# Patient Record
Sex: Male | Born: 1935 | Race: White | Hispanic: No | Marital: Married | Smoking: Never smoker
Health system: Southern US, Community
[De-identification: ages and names within clinical notes are randomized; demographics above are authoritative.]

## PROBLEM LIST (undated history)

## (undated) DIAGNOSIS — I10 Essential (primary) hypertension: Secondary | ICD-10-CM

## (undated) DIAGNOSIS — J45909 Unspecified asthma, uncomplicated: Secondary | ICD-10-CM

## (undated) DIAGNOSIS — D649 Anemia, unspecified: Secondary | ICD-10-CM

## (undated) DIAGNOSIS — Z87442 Personal history of urinary calculi: Secondary | ICD-10-CM

## (undated) DIAGNOSIS — Z87898 Personal history of other specified conditions: Secondary | ICD-10-CM

## (undated) DIAGNOSIS — Z973 Presence of spectacles and contact lenses: Secondary | ICD-10-CM

## (undated) DIAGNOSIS — K219 Gastro-esophageal reflux disease without esophagitis: Secondary | ICD-10-CM

## (undated) DIAGNOSIS — E785 Hyperlipidemia, unspecified: Secondary | ICD-10-CM

## (undated) DIAGNOSIS — N189 Chronic kidney disease, unspecified: Secondary | ICD-10-CM

## (undated) DIAGNOSIS — N401 Enlarged prostate with lower urinary tract symptoms: Secondary | ICD-10-CM

## (undated) DIAGNOSIS — C61 Malignant neoplasm of prostate: Secondary | ICD-10-CM

## (undated) HISTORY — PX: TONSILLECTOMY: SUR1361

## (undated) HISTORY — DX: Hyperlipidemia, unspecified: E78.5

## (undated) HISTORY — DX: Malignant neoplasm of prostate: C61

## (undated) HISTORY — PX: INGUINAL HERNIA REPAIR: SUR1180

## (undated) HISTORY — PX: COLONOSCOPY: SHX174

---

## 2001-09-25 ENCOUNTER — Ambulatory Visit (HOSPITAL_COMMUNITY): Admission: RE | Admit: 2001-09-25 | Discharge: 2001-09-25 | Payer: Self-pay | Admitting: *Deleted

## 2006-11-20 ENCOUNTER — Encounter: Admission: RE | Admit: 2006-11-20 | Discharge: 2006-11-20 | Payer: Self-pay | Admitting: Internal Medicine

## 2006-11-23 ENCOUNTER — Ambulatory Visit (HOSPITAL_COMMUNITY): Admission: RE | Admit: 2006-11-23 | Discharge: 2006-11-23 | Payer: Self-pay | Admitting: *Deleted

## 2008-03-14 HISTORY — PX: EYE SURGERY: SHX253

## 2010-07-27 NOTE — Op Note (Signed)
Darrell Hernandez, Darrell Hernandez                ACCOUNT NO.:  [id number]   MEDICAL RECORD NO.:  [id number]          PATIENT TYPE:  AMB   LOCATION:  ENDO                         FACILITY:  [facility], [facility]   PHYSICIAN:  Georgiana Spinner, M.D.    DATE OF BIRTH:  05-19-1935   DATE OF PROCEDURE:  11/23/2006  DATE OF DISCHARGE:                               OPERATIVE REPORT   PROCEDURE:  Upper endoscopy.   INDICATIONS:  Abdominal pain, GI blood loss question.   ANESTHESIA:  Demerol 50 mg, Versed 7.5 mg.   DESCRIPTION OF PROCEDURE:  With the patient mildly sedated in the left  lateral decubitus position, the Pentax videoscopic endoscope was  inserted in the mouth and passed under direct vision through the  esophagus which appeared normal.  There was no evidence of reflux  esophagitis or Barrett's esophagus.  We entered into the stomach.  The  fundus, body, antrum, duodenal bulb, and second portion of the duodenum  all appeared normal.  From this point, the endoscope was slowly  withdrawn taking circumferential views of the duodenal mucosa until the  endoscope had been pulled back into the stomach and placed in  retroflexion to view the stomach from below. The endoscope was  straightened and withdrawn taking circumferential views of the remaining  gastric and esophageal mucosa.  The patient's vital signs and pulse  oximeter remained stable.  The patient tolerated the procedure well  without apparent complications.   FINDINGS:  Negative examination.   PLAN:  Have patient follow-up with me as an outpatient as needed.           ______________________________  Georgiana Spinner, M.D.     GMO/MEDQ  D:  11/23/2006  T:  11/23/2006  Job:  [id number]

## 2010-07-30 NOTE — Procedures (Signed)
[place]  Patient:    Darrell Hernandez, Darrell Hernandez Visit Number: [id number] MRN: [id number]          Service Type: END Location: ENDO Attending Physician:  Sabino Gasser Dictated by:   Sabino Gasser, M.D. Proc. Date: 09/25/01 Admit Date:  09/25/2001 Discharge Date: 09/25/2001                             Procedure Report  PROCEDURE PERFORMED:  Colonoscopy.  ENDOSCOPIST:  Sabino Gasser, M.D.  INDICATIONS FOR PROCEDURE:  Rectal bleeding.  ANESTHESIA:  Demerol 100 mg, Versed 10 mg.  DESCRIPTION OF PROCEDURE:  With the patient mildly sedated in the left lateral decubitus position, a rectal examination was performed which was unremarkable.  Subsequently, the Olympus videoscopic colonoscope was inserted in the rectum and passed under direct vision into the cecum, identified by the ileocecal valve and appendiceal orifice.  From this point, the colonoscope was slowly withdrawn, taking circumferential views of the entire colonic mucosa stopping only in the rectum, which appeared normal on direct and showed hemorrhoids on retroflex view.  The endoscope was straightened and withdrawn.  Patients vital signs and pulse oximeter remained stable.  The patient tolerated the procedure well and without apparent complications.  FINDINGS:  Internal hemorrhoids.  Otherwise unremarkable examination.  PLAN:  Followup this patient in five years. Dictated by:   Sabino Gasser, M.D. Attending Physician:  Sabino Gasser DD:  09/25/01 TD:  09/27/01 Job: [id number]

## 2011-03-30 DIAGNOSIS — H35379 Puckering of macula, unspecified eye: Secondary | ICD-10-CM | POA: Diagnosis not present

## 2011-03-30 DIAGNOSIS — H269 Unspecified cataract: Secondary | ICD-10-CM | POA: Diagnosis not present

## 2011-07-21 DIAGNOSIS — E7800 Pure hypercholesterolemia, unspecified: Secondary | ICD-10-CM | POA: Diagnosis not present

## 2011-07-21 DIAGNOSIS — I10 Essential (primary) hypertension: Secondary | ICD-10-CM | POA: Diagnosis not present

## 2011-07-21 DIAGNOSIS — Z125 Encounter for screening for malignant neoplasm of prostate: Secondary | ICD-10-CM | POA: Diagnosis not present

## 2011-07-28 DIAGNOSIS — I10 Essential (primary) hypertension: Secondary | ICD-10-CM | POA: Diagnosis not present

## 2011-07-28 DIAGNOSIS — N179 Acute kidney failure, unspecified: Secondary | ICD-10-CM | POA: Diagnosis not present

## 2011-07-28 DIAGNOSIS — R972 Elevated prostate specific antigen [PSA]: Secondary | ICD-10-CM | POA: Diagnosis not present

## 2011-08-03 DIAGNOSIS — N179 Acute kidney failure, unspecified: Secondary | ICD-10-CM | POA: Diagnosis not present

## 2011-08-30 DIAGNOSIS — I10 Essential (primary) hypertension: Secondary | ICD-10-CM | POA: Diagnosis not present

## 2011-09-19 DIAGNOSIS — R972 Elevated prostate specific antigen [PSA]: Secondary | ICD-10-CM | POA: Diagnosis not present

## 2011-09-19 DIAGNOSIS — K4090 Unilateral inguinal hernia, without obstruction or gangrene, not specified as recurrent: Secondary | ICD-10-CM | POA: Diagnosis not present

## 2011-12-12 DIAGNOSIS — L570 Actinic keratosis: Secondary | ICD-10-CM | POA: Diagnosis not present

## 2011-12-12 DIAGNOSIS — L439 Lichen planus, unspecified: Secondary | ICD-10-CM | POA: Diagnosis not present

## 2011-12-12 DIAGNOSIS — L821 Other seborrheic keratosis: Secondary | ICD-10-CM | POA: Diagnosis not present

## 2011-12-12 DIAGNOSIS — Z808 Family history of malignant neoplasm of other organs or systems: Secondary | ICD-10-CM | POA: Diagnosis not present

## 2011-12-12 DIAGNOSIS — D485 Neoplasm of uncertain behavior of skin: Secondary | ICD-10-CM | POA: Diagnosis not present

## 2011-12-12 DIAGNOSIS — Z85828 Personal history of other malignant neoplasm of skin: Secondary | ICD-10-CM | POA: Diagnosis not present

## 2012-02-01 DIAGNOSIS — C44721 Squamous cell carcinoma of skin of unspecified lower limb, including hip: Secondary | ICD-10-CM | POA: Diagnosis not present

## 2012-02-01 DIAGNOSIS — L905 Scar conditions and fibrosis of skin: Secondary | ICD-10-CM | POA: Diagnosis not present

## 2012-02-02 DIAGNOSIS — R972 Elevated prostate specific antigen [PSA]: Secondary | ICD-10-CM | POA: Diagnosis not present

## 2012-02-02 DIAGNOSIS — Z23 Encounter for immunization: Secondary | ICD-10-CM | POA: Diagnosis not present

## 2012-02-02 DIAGNOSIS — F411 Generalized anxiety disorder: Secondary | ICD-10-CM | POA: Diagnosis not present

## 2012-02-02 DIAGNOSIS — E7800 Pure hypercholesterolemia, unspecified: Secondary | ICD-10-CM | POA: Diagnosis not present

## 2012-02-02 DIAGNOSIS — I10 Essential (primary) hypertension: Secondary | ICD-10-CM | POA: Diagnosis not present

## 2012-02-15 DIAGNOSIS — I10 Essential (primary) hypertension: Secondary | ICD-10-CM | POA: Diagnosis not present

## 2012-02-15 DIAGNOSIS — K219 Gastro-esophageal reflux disease without esophagitis: Secondary | ICD-10-CM | POA: Diagnosis not present

## 2012-02-15 DIAGNOSIS — Z1211 Encounter for screening for malignant neoplasm of colon: Secondary | ICD-10-CM | POA: Diagnosis not present

## 2012-03-19 DIAGNOSIS — R972 Elevated prostate specific antigen [PSA]: Secondary | ICD-10-CM | POA: Diagnosis not present

## 2012-03-22 DIAGNOSIS — Z86010 Personal history of colonic polyps: Secondary | ICD-10-CM | POA: Diagnosis not present

## 2012-03-22 DIAGNOSIS — D126 Benign neoplasm of colon, unspecified: Secondary | ICD-10-CM | POA: Diagnosis not present

## 2012-03-26 DIAGNOSIS — K4090 Unilateral inguinal hernia, without obstruction or gangrene, not specified as recurrent: Secondary | ICD-10-CM | POA: Diagnosis not present

## 2012-03-26 DIAGNOSIS — R972 Elevated prostate specific antigen [PSA]: Secondary | ICD-10-CM | POA: Diagnosis not present

## 2012-05-15 DIAGNOSIS — L570 Actinic keratosis: Secondary | ICD-10-CM | POA: Diagnosis not present

## 2012-05-15 DIAGNOSIS — L905 Scar conditions and fibrosis of skin: Secondary | ICD-10-CM | POA: Diagnosis not present

## 2012-05-15 DIAGNOSIS — C44721 Squamous cell carcinoma of skin of unspecified lower limb, including hip: Secondary | ICD-10-CM | POA: Diagnosis not present

## 2012-05-15 DIAGNOSIS — Z85828 Personal history of other malignant neoplasm of skin: Secondary | ICD-10-CM | POA: Diagnosis not present

## 2012-05-15 DIAGNOSIS — D485 Neoplasm of uncertain behavior of skin: Secondary | ICD-10-CM | POA: Diagnosis not present

## 2012-05-15 DIAGNOSIS — L821 Other seborrheic keratosis: Secondary | ICD-10-CM | POA: Diagnosis not present

## 2012-06-04 DIAGNOSIS — H2510 Age-related nuclear cataract, unspecified eye: Secondary | ICD-10-CM | POA: Diagnosis not present

## 2012-06-04 DIAGNOSIS — Z961 Presence of intraocular lens: Secondary | ICD-10-CM | POA: Diagnosis not present

## 2012-06-04 DIAGNOSIS — H2640 Unspecified secondary cataract: Secondary | ICD-10-CM | POA: Diagnosis not present

## 2012-08-09 DIAGNOSIS — I10 Essential (primary) hypertension: Secondary | ICD-10-CM | POA: Diagnosis not present

## 2012-08-09 DIAGNOSIS — F411 Generalized anxiety disorder: Secondary | ICD-10-CM | POA: Diagnosis not present

## 2012-08-15 DIAGNOSIS — I10 Essential (primary) hypertension: Secondary | ICD-10-CM | POA: Diagnosis not present

## 2012-08-15 DIAGNOSIS — K219 Gastro-esophageal reflux disease without esophagitis: Secondary | ICD-10-CM | POA: Diagnosis not present

## 2012-08-15 DIAGNOSIS — R972 Elevated prostate specific antigen [PSA]: Secondary | ICD-10-CM | POA: Diagnosis not present

## 2012-08-15 DIAGNOSIS — E7800 Pure hypercholesterolemia, unspecified: Secondary | ICD-10-CM | POA: Diagnosis not present

## 2012-09-26 DIAGNOSIS — D485 Neoplasm of uncertain behavior of skin: Secondary | ICD-10-CM | POA: Diagnosis not present

## 2012-09-26 DIAGNOSIS — C44621 Squamous cell carcinoma of skin of unspecified upper limb, including shoulder: Secondary | ICD-10-CM | POA: Diagnosis not present

## 2012-09-26 DIAGNOSIS — C44721 Squamous cell carcinoma of skin of unspecified lower limb, including hip: Secondary | ICD-10-CM | POA: Diagnosis not present

## 2012-09-26 DIAGNOSIS — L570 Actinic keratosis: Secondary | ICD-10-CM | POA: Diagnosis not present

## 2012-10-19 DIAGNOSIS — H2640 Unspecified secondary cataract: Secondary | ICD-10-CM | POA: Diagnosis not present

## 2012-11-06 DIAGNOSIS — C44621 Squamous cell carcinoma of skin of unspecified upper limb, including shoulder: Secondary | ICD-10-CM | POA: Diagnosis not present

## 2012-11-06 DIAGNOSIS — L905 Scar conditions and fibrosis of skin: Secondary | ICD-10-CM | POA: Diagnosis not present

## 2012-11-06 DIAGNOSIS — C4492 Squamous cell carcinoma of skin, unspecified: Secondary | ICD-10-CM | POA: Diagnosis not present

## 2012-12-13 DIAGNOSIS — I10 Essential (primary) hypertension: Secondary | ICD-10-CM | POA: Diagnosis not present

## 2012-12-19 DIAGNOSIS — E7800 Pure hypercholesterolemia, unspecified: Secondary | ICD-10-CM | POA: Diagnosis not present

## 2012-12-19 DIAGNOSIS — Z23 Encounter for immunization: Secondary | ICD-10-CM | POA: Diagnosis not present

## 2012-12-19 DIAGNOSIS — K219 Gastro-esophageal reflux disease without esophagitis: Secondary | ICD-10-CM | POA: Diagnosis not present

## 2012-12-19 DIAGNOSIS — I10 Essential (primary) hypertension: Secondary | ICD-10-CM | POA: Diagnosis not present

## 2012-12-31 DIAGNOSIS — Z808 Family history of malignant neoplasm of other organs or systems: Secondary | ICD-10-CM | POA: Diagnosis not present

## 2012-12-31 DIAGNOSIS — L570 Actinic keratosis: Secondary | ICD-10-CM | POA: Diagnosis not present

## 2012-12-31 DIAGNOSIS — L819 Disorder of pigmentation, unspecified: Secondary | ICD-10-CM | POA: Diagnosis not present

## 2012-12-31 DIAGNOSIS — Z85828 Personal history of other malignant neoplasm of skin: Secondary | ICD-10-CM | POA: Diagnosis not present

## 2012-12-31 DIAGNOSIS — L0889 Other specified local infections of the skin and subcutaneous tissue: Secondary | ICD-10-CM | POA: Diagnosis not present

## 2012-12-31 DIAGNOSIS — L821 Other seborrheic keratosis: Secondary | ICD-10-CM | POA: Diagnosis not present

## 2012-12-31 DIAGNOSIS — D485 Neoplasm of uncertain behavior of skin: Secondary | ICD-10-CM | POA: Diagnosis not present

## 2012-12-31 DIAGNOSIS — L820 Inflamed seborrheic keratosis: Secondary | ICD-10-CM | POA: Diagnosis not present

## 2013-03-14 HISTORY — PX: CATARACT EXTRACTION W/ INTRAOCULAR LENS IMPLANT: SHX1309

## 2013-04-18 DIAGNOSIS — R972 Elevated prostate specific antigen [PSA]: Secondary | ICD-10-CM | POA: Diagnosis not present

## 2013-04-24 DIAGNOSIS — N318 Other neuromuscular dysfunction of bladder: Secondary | ICD-10-CM | POA: Diagnosis not present

## 2013-04-24 DIAGNOSIS — R972 Elevated prostate specific antigen [PSA]: Secondary | ICD-10-CM | POA: Diagnosis not present

## 2013-06-20 DIAGNOSIS — I10 Essential (primary) hypertension: Secondary | ICD-10-CM | POA: Diagnosis not present

## 2013-06-25 DIAGNOSIS — R972 Elevated prostate specific antigen [PSA]: Secondary | ICD-10-CM | POA: Diagnosis not present

## 2013-06-25 DIAGNOSIS — K219 Gastro-esophageal reflux disease without esophagitis: Secondary | ICD-10-CM | POA: Diagnosis not present

## 2013-06-25 DIAGNOSIS — E7800 Pure hypercholesterolemia, unspecified: Secondary | ICD-10-CM | POA: Diagnosis not present

## 2013-06-25 DIAGNOSIS — I10 Essential (primary) hypertension: Secondary | ICD-10-CM | POA: Diagnosis not present

## 2013-07-16 DIAGNOSIS — K219 Gastro-esophageal reflux disease without esophagitis: Secondary | ICD-10-CM | POA: Diagnosis not present

## 2013-07-16 DIAGNOSIS — E7800 Pure hypercholesterolemia, unspecified: Secondary | ICD-10-CM | POA: Diagnosis not present

## 2013-07-16 DIAGNOSIS — I10 Essential (primary) hypertension: Secondary | ICD-10-CM | POA: Diagnosis not present

## 2013-07-29 DIAGNOSIS — I10 Essential (primary) hypertension: Secondary | ICD-10-CM | POA: Diagnosis not present

## 2013-07-29 DIAGNOSIS — N183 Chronic kidney disease, stage 3 unspecified: Secondary | ICD-10-CM | POA: Diagnosis not present

## 2013-07-29 DIAGNOSIS — D0470 Carcinoma in situ of skin of unspecified lower limb, including hip: Secondary | ICD-10-CM | POA: Diagnosis not present

## 2013-07-29 DIAGNOSIS — Z85828 Personal history of other malignant neoplasm of skin: Secondary | ICD-10-CM | POA: Diagnosis not present

## 2013-07-29 DIAGNOSIS — L570 Actinic keratosis: Secondary | ICD-10-CM | POA: Diagnosis not present

## 2013-07-29 DIAGNOSIS — D485 Neoplasm of uncertain behavior of skin: Secondary | ICD-10-CM | POA: Diagnosis not present

## 2013-07-29 DIAGNOSIS — L821 Other seborrheic keratosis: Secondary | ICD-10-CM | POA: Diagnosis not present

## 2013-07-29 DIAGNOSIS — Z808 Family history of malignant neoplasm of other organs or systems: Secondary | ICD-10-CM | POA: Diagnosis not present

## 2013-08-06 DIAGNOSIS — D0470 Carcinoma in situ of skin of unspecified lower limb, including hip: Secondary | ICD-10-CM | POA: Diagnosis not present

## 2013-11-09 DIAGNOSIS — M5430 Sciatica, unspecified side: Secondary | ICD-10-CM | POA: Diagnosis not present

## 2013-11-12 DIAGNOSIS — M5430 Sciatica, unspecified side: Secondary | ICD-10-CM | POA: Diagnosis not present

## 2013-11-13 ENCOUNTER — Other Ambulatory Visit: Payer: Self-pay | Admitting: Internal Medicine

## 2013-11-13 DIAGNOSIS — M4310 Spondylolisthesis, site unspecified: Secondary | ICD-10-CM | POA: Diagnosis not present

## 2013-11-13 DIAGNOSIS — M5431 Sciatica, right side: Secondary | ICD-10-CM

## 2013-11-15 ENCOUNTER — Ambulatory Visit
Admission: RE | Admit: 2013-11-15 | Discharge: 2013-11-15 | Disposition: A | Discharge status: Home / Self Care | Payer: Medicare Other | Source: Ambulatory Visit | Attending: Internal Medicine | Admitting: Internal Medicine

## 2013-11-15 DIAGNOSIS — IMO0002 Reserved for concepts with insufficient information to code with codable children: Secondary | ICD-10-CM | POA: Diagnosis not present

## 2013-11-15 DIAGNOSIS — M5431 Sciatica, right side: Secondary | ICD-10-CM

## 2013-11-15 DIAGNOSIS — M48061 Spinal stenosis, lumbar region without neurogenic claudication: Secondary | ICD-10-CM | POA: Diagnosis not present

## 2013-11-15 DIAGNOSIS — M5126 Other intervertebral disc displacement, lumbar region: Secondary | ICD-10-CM | POA: Diagnosis not present

## 2013-11-19 DIAGNOSIS — G894 Chronic pain syndrome: Secondary | ICD-10-CM | POA: Diagnosis not present

## 2013-11-19 DIAGNOSIS — M545 Low back pain, unspecified: Secondary | ICD-10-CM | POA: Diagnosis not present

## 2013-11-19 DIAGNOSIS — M5430 Sciatica, unspecified side: Secondary | ICD-10-CM | POA: Diagnosis not present

## 2013-11-19 DIAGNOSIS — M25559 Pain in unspecified hip: Secondary | ICD-10-CM | POA: Diagnosis not present

## 2013-12-05 DIAGNOSIS — M79609 Pain in unspecified limb: Secondary | ICD-10-CM | POA: Diagnosis not present

## 2013-12-05 DIAGNOSIS — IMO0002 Reserved for concepts with insufficient information to code with codable children: Secondary | ICD-10-CM | POA: Diagnosis not present

## 2013-12-11 ENCOUNTER — Other Ambulatory Visit: Payer: Self-pay | Admitting: Nephrology

## 2013-12-11 DIAGNOSIS — N183 Chronic kidney disease, stage 3 unspecified: Secondary | ICD-10-CM | POA: Diagnosis not present

## 2013-12-11 DIAGNOSIS — R972 Elevated prostate specific antigen [PSA]: Secondary | ICD-10-CM | POA: Diagnosis not present

## 2013-12-11 DIAGNOSIS — I120 Hypertensive chronic kidney disease with stage 5 chronic kidney disease or end stage renal disease: Secondary | ICD-10-CM | POA: Diagnosis not present

## 2013-12-11 DIAGNOSIS — E785 Hyperlipidemia, unspecified: Secondary | ICD-10-CM | POA: Diagnosis not present

## 2013-12-18 DIAGNOSIS — Z961 Presence of intraocular lens: Secondary | ICD-10-CM | POA: Diagnosis not present

## 2013-12-18 DIAGNOSIS — H2512 Age-related nuclear cataract, left eye: Secondary | ICD-10-CM | POA: Diagnosis not present

## 2013-12-19 DIAGNOSIS — M47817 Spondylosis without myelopathy or radiculopathy, lumbosacral region: Secondary | ICD-10-CM | POA: Diagnosis not present

## 2013-12-19 DIAGNOSIS — D649 Anemia, unspecified: Secondary | ICD-10-CM | POA: Diagnosis not present

## 2013-12-19 DIAGNOSIS — I10 Essential (primary) hypertension: Secondary | ICD-10-CM | POA: Diagnosis not present

## 2013-12-20 ENCOUNTER — Ambulatory Visit
Admission: RE | Admit: 2013-12-20 | Discharge: 2013-12-20 | Disposition: A | Discharge status: Home / Self Care | Payer: Medicare Other | Source: Ambulatory Visit | Attending: Nephrology | Admitting: Nephrology

## 2013-12-20 DIAGNOSIS — N183 Chronic kidney disease, stage 3 (moderate): Secondary | ICD-10-CM

## 2013-12-20 DIAGNOSIS — N281 Cyst of kidney, acquired: Secondary | ICD-10-CM | POA: Diagnosis not present

## 2013-12-24 DIAGNOSIS — I10 Essential (primary) hypertension: Secondary | ICD-10-CM | POA: Diagnosis not present

## 2013-12-24 DIAGNOSIS — K219 Gastro-esophageal reflux disease without esophagitis: Secondary | ICD-10-CM | POA: Diagnosis not present

## 2013-12-24 DIAGNOSIS — N183 Chronic kidney disease, stage 3 (moderate): Secondary | ICD-10-CM | POA: Diagnosis not present

## 2013-12-24 DIAGNOSIS — E780 Pure hypercholesterolemia: Secondary | ICD-10-CM | POA: Diagnosis not present

## 2014-02-18 DIAGNOSIS — I10 Essential (primary) hypertension: Secondary | ICD-10-CM | POA: Diagnosis not present

## 2014-02-21 DIAGNOSIS — E785 Hyperlipidemia, unspecified: Secondary | ICD-10-CM | POA: Diagnosis not present

## 2014-02-21 DIAGNOSIS — N183 Chronic kidney disease, stage 3 (moderate): Secondary | ICD-10-CM | POA: Diagnosis not present

## 2014-02-21 DIAGNOSIS — I129 Hypertensive chronic kidney disease with stage 1 through stage 4 chronic kidney disease, or unspecified chronic kidney disease: Secondary | ICD-10-CM | POA: Diagnosis not present

## 2014-02-21 DIAGNOSIS — R972 Elevated prostate specific antigen [PSA]: Secondary | ICD-10-CM | POA: Diagnosis not present

## 2014-03-04 DIAGNOSIS — I10 Essential (primary) hypertension: Secondary | ICD-10-CM | POA: Diagnosis not present

## 2014-06-23 DIAGNOSIS — I10 Essential (primary) hypertension: Secondary | ICD-10-CM | POA: Diagnosis not present

## 2014-06-26 DIAGNOSIS — E780 Pure hypercholesterolemia: Secondary | ICD-10-CM | POA: Diagnosis not present

## 2014-06-26 DIAGNOSIS — K219 Gastro-esophageal reflux disease without esophagitis: Secondary | ICD-10-CM | POA: Diagnosis not present

## 2014-06-26 DIAGNOSIS — I10 Essential (primary) hypertension: Secondary | ICD-10-CM | POA: Diagnosis not present

## 2014-06-26 DIAGNOSIS — M25512 Pain in left shoulder: Secondary | ICD-10-CM | POA: Diagnosis not present

## 2014-08-04 DIAGNOSIS — M7591 Shoulder lesion, unspecified, right shoulder: Secondary | ICD-10-CM | POA: Diagnosis not present

## 2014-08-04 DIAGNOSIS — M25519 Pain in unspecified shoulder: Secondary | ICD-10-CM | POA: Diagnosis not present

## 2014-08-04 DIAGNOSIS — M19011 Primary osteoarthritis, right shoulder: Secondary | ICD-10-CM | POA: Diagnosis not present

## 2014-08-04 DIAGNOSIS — M7592 Shoulder lesion, unspecified, left shoulder: Secondary | ICD-10-CM | POA: Diagnosis not present

## 2014-08-04 DIAGNOSIS — M19012 Primary osteoarthritis, left shoulder: Secondary | ICD-10-CM | POA: Diagnosis not present

## 2014-08-04 DIAGNOSIS — M7580 Other shoulder lesions, unspecified shoulder: Secondary | ICD-10-CM | POA: Diagnosis not present

## 2014-08-07 DIAGNOSIS — M25511 Pain in right shoulder: Secondary | ICD-10-CM | POA: Diagnosis not present

## 2014-08-07 DIAGNOSIS — M25512 Pain in left shoulder: Secondary | ICD-10-CM | POA: Diagnosis not present

## 2014-08-07 DIAGNOSIS — M25612 Stiffness of left shoulder, not elsewhere classified: Secondary | ICD-10-CM | POA: Diagnosis not present

## 2014-08-07 DIAGNOSIS — M25611 Stiffness of right shoulder, not elsewhere classified: Secondary | ICD-10-CM | POA: Diagnosis not present

## 2014-08-08 DIAGNOSIS — M25612 Stiffness of left shoulder, not elsewhere classified: Secondary | ICD-10-CM | POA: Diagnosis not present

## 2014-08-08 DIAGNOSIS — M25512 Pain in left shoulder: Secondary | ICD-10-CM | POA: Diagnosis not present

## 2014-08-08 DIAGNOSIS — M25611 Stiffness of right shoulder, not elsewhere classified: Secondary | ICD-10-CM | POA: Diagnosis not present

## 2014-08-08 DIAGNOSIS — M25511 Pain in right shoulder: Secondary | ICD-10-CM | POA: Diagnosis not present

## 2014-08-14 DIAGNOSIS — M25512 Pain in left shoulder: Secondary | ICD-10-CM | POA: Diagnosis not present

## 2014-08-14 DIAGNOSIS — M25511 Pain in right shoulder: Secondary | ICD-10-CM | POA: Diagnosis not present

## 2014-08-14 DIAGNOSIS — M25611 Stiffness of right shoulder, not elsewhere classified: Secondary | ICD-10-CM | POA: Diagnosis not present

## 2014-08-14 DIAGNOSIS — M25612 Stiffness of left shoulder, not elsewhere classified: Secondary | ICD-10-CM | POA: Diagnosis not present

## 2014-08-18 DIAGNOSIS — M25511 Pain in right shoulder: Secondary | ICD-10-CM | POA: Diagnosis not present

## 2014-08-18 DIAGNOSIS — M25611 Stiffness of right shoulder, not elsewhere classified: Secondary | ICD-10-CM | POA: Diagnosis not present

## 2014-08-18 DIAGNOSIS — M25512 Pain in left shoulder: Secondary | ICD-10-CM | POA: Diagnosis not present

## 2014-08-18 DIAGNOSIS — M25612 Stiffness of left shoulder, not elsewhere classified: Secondary | ICD-10-CM | POA: Diagnosis not present

## 2014-08-20 DIAGNOSIS — L821 Other seborrheic keratosis: Secondary | ICD-10-CM | POA: Diagnosis not present

## 2014-08-20 DIAGNOSIS — M25512 Pain in left shoulder: Secondary | ICD-10-CM | POA: Diagnosis not present

## 2014-08-20 DIAGNOSIS — M25511 Pain in right shoulder: Secondary | ICD-10-CM | POA: Diagnosis not present

## 2014-08-20 DIAGNOSIS — L219 Seborrheic dermatitis, unspecified: Secondary | ICD-10-CM | POA: Diagnosis not present

## 2014-08-20 DIAGNOSIS — Z85828 Personal history of other malignant neoplasm of skin: Secondary | ICD-10-CM | POA: Diagnosis not present

## 2014-08-20 DIAGNOSIS — L570 Actinic keratosis: Secondary | ICD-10-CM | POA: Diagnosis not present

## 2014-08-20 DIAGNOSIS — M25611 Stiffness of right shoulder, not elsewhere classified: Secondary | ICD-10-CM | POA: Diagnosis not present

## 2014-08-20 DIAGNOSIS — M25612 Stiffness of left shoulder, not elsewhere classified: Secondary | ICD-10-CM | POA: Diagnosis not present

## 2014-08-20 DIAGNOSIS — D485 Neoplasm of uncertain behavior of skin: Secondary | ICD-10-CM | POA: Diagnosis not present

## 2014-08-28 DIAGNOSIS — M25512 Pain in left shoulder: Secondary | ICD-10-CM | POA: Diagnosis not present

## 2014-08-28 DIAGNOSIS — M25612 Stiffness of left shoulder, not elsewhere classified: Secondary | ICD-10-CM | POA: Diagnosis not present

## 2014-08-28 DIAGNOSIS — M25611 Stiffness of right shoulder, not elsewhere classified: Secondary | ICD-10-CM | POA: Diagnosis not present

## 2014-08-28 DIAGNOSIS — M25511 Pain in right shoulder: Secondary | ICD-10-CM | POA: Diagnosis not present

## 2014-09-02 DIAGNOSIS — M25612 Stiffness of left shoulder, not elsewhere classified: Secondary | ICD-10-CM | POA: Diagnosis not present

## 2014-09-02 DIAGNOSIS — M25611 Stiffness of right shoulder, not elsewhere classified: Secondary | ICD-10-CM | POA: Diagnosis not present

## 2014-09-02 DIAGNOSIS — M25512 Pain in left shoulder: Secondary | ICD-10-CM | POA: Diagnosis not present

## 2014-09-02 DIAGNOSIS — M25511 Pain in right shoulder: Secondary | ICD-10-CM | POA: Diagnosis not present

## 2014-09-04 DIAGNOSIS — M25612 Stiffness of left shoulder, not elsewhere classified: Secondary | ICD-10-CM | POA: Diagnosis not present

## 2014-09-04 DIAGNOSIS — M25611 Stiffness of right shoulder, not elsewhere classified: Secondary | ICD-10-CM | POA: Diagnosis not present

## 2014-09-04 DIAGNOSIS — M25511 Pain in right shoulder: Secondary | ICD-10-CM | POA: Diagnosis not present

## 2014-09-04 DIAGNOSIS — M25512 Pain in left shoulder: Secondary | ICD-10-CM | POA: Diagnosis not present

## 2014-09-09 DIAGNOSIS — M25611 Stiffness of right shoulder, not elsewhere classified: Secondary | ICD-10-CM | POA: Diagnosis not present

## 2014-09-09 DIAGNOSIS — M25512 Pain in left shoulder: Secondary | ICD-10-CM | POA: Diagnosis not present

## 2014-09-09 DIAGNOSIS — M25612 Stiffness of left shoulder, not elsewhere classified: Secondary | ICD-10-CM | POA: Diagnosis not present

## 2014-09-09 DIAGNOSIS — M25511 Pain in right shoulder: Secondary | ICD-10-CM | POA: Diagnosis not present

## 2014-09-11 DIAGNOSIS — M25511 Pain in right shoulder: Secondary | ICD-10-CM | POA: Diagnosis not present

## 2014-09-11 DIAGNOSIS — M25611 Stiffness of right shoulder, not elsewhere classified: Secondary | ICD-10-CM | POA: Diagnosis not present

## 2014-09-11 DIAGNOSIS — M25512 Pain in left shoulder: Secondary | ICD-10-CM | POA: Diagnosis not present

## 2014-09-11 DIAGNOSIS — M25612 Stiffness of left shoulder, not elsewhere classified: Secondary | ICD-10-CM | POA: Diagnosis not present

## 2014-09-17 DIAGNOSIS — M25512 Pain in left shoulder: Secondary | ICD-10-CM | POA: Diagnosis not present

## 2014-09-17 DIAGNOSIS — M25611 Stiffness of right shoulder, not elsewhere classified: Secondary | ICD-10-CM | POA: Diagnosis not present

## 2014-09-17 DIAGNOSIS — M25511 Pain in right shoulder: Secondary | ICD-10-CM | POA: Diagnosis not present

## 2014-09-17 DIAGNOSIS — M25612 Stiffness of left shoulder, not elsewhere classified: Secondary | ICD-10-CM | POA: Diagnosis not present

## 2014-09-19 DIAGNOSIS — M25512 Pain in left shoulder: Secondary | ICD-10-CM | POA: Diagnosis not present

## 2014-09-19 DIAGNOSIS — M25511 Pain in right shoulder: Secondary | ICD-10-CM | POA: Diagnosis not present

## 2014-09-19 DIAGNOSIS — M25611 Stiffness of right shoulder, not elsewhere classified: Secondary | ICD-10-CM | POA: Diagnosis not present

## 2014-09-19 DIAGNOSIS — M25612 Stiffness of left shoulder, not elsewhere classified: Secondary | ICD-10-CM | POA: Diagnosis not present

## 2014-09-23 DIAGNOSIS — M25612 Stiffness of left shoulder, not elsewhere classified: Secondary | ICD-10-CM | POA: Diagnosis not present

## 2014-09-23 DIAGNOSIS — M25511 Pain in right shoulder: Secondary | ICD-10-CM | POA: Diagnosis not present

## 2014-09-23 DIAGNOSIS — M25512 Pain in left shoulder: Secondary | ICD-10-CM | POA: Diagnosis not present

## 2014-09-23 DIAGNOSIS — M25611 Stiffness of right shoulder, not elsewhere classified: Secondary | ICD-10-CM | POA: Diagnosis not present

## 2014-09-25 DIAGNOSIS — M25511 Pain in right shoulder: Secondary | ICD-10-CM | POA: Diagnosis not present

## 2014-09-25 DIAGNOSIS — M25611 Stiffness of right shoulder, not elsewhere classified: Secondary | ICD-10-CM | POA: Diagnosis not present

## 2014-09-25 DIAGNOSIS — M25612 Stiffness of left shoulder, not elsewhere classified: Secondary | ICD-10-CM | POA: Diagnosis not present

## 2014-09-25 DIAGNOSIS — M25512 Pain in left shoulder: Secondary | ICD-10-CM | POA: Diagnosis not present

## 2014-09-26 DIAGNOSIS — Z87442 Personal history of urinary calculi: Secondary | ICD-10-CM | POA: Diagnosis not present

## 2014-09-26 DIAGNOSIS — I129 Hypertensive chronic kidney disease with stage 1 through stage 4 chronic kidney disease, or unspecified chronic kidney disease: Secondary | ICD-10-CM | POA: Diagnosis not present

## 2014-09-26 DIAGNOSIS — E785 Hyperlipidemia, unspecified: Secondary | ICD-10-CM | POA: Diagnosis not present

## 2014-09-26 DIAGNOSIS — N183 Chronic kidney disease, stage 3 (moderate): Secondary | ICD-10-CM | POA: Diagnosis not present

## 2014-09-29 DIAGNOSIS — M25612 Stiffness of left shoulder, not elsewhere classified: Secondary | ICD-10-CM | POA: Diagnosis not present

## 2014-09-29 DIAGNOSIS — M25512 Pain in left shoulder: Secondary | ICD-10-CM | POA: Diagnosis not present

## 2014-09-29 DIAGNOSIS — M25611 Stiffness of right shoulder, not elsewhere classified: Secondary | ICD-10-CM | POA: Diagnosis not present

## 2014-09-29 DIAGNOSIS — M25511 Pain in right shoulder: Secondary | ICD-10-CM | POA: Diagnosis not present

## 2014-10-02 DIAGNOSIS — M25612 Stiffness of left shoulder, not elsewhere classified: Secondary | ICD-10-CM | POA: Diagnosis not present

## 2014-10-02 DIAGNOSIS — M25511 Pain in right shoulder: Secondary | ICD-10-CM | POA: Diagnosis not present

## 2014-10-02 DIAGNOSIS — M25512 Pain in left shoulder: Secondary | ICD-10-CM | POA: Diagnosis not present

## 2014-10-02 DIAGNOSIS — M25611 Stiffness of right shoulder, not elsewhere classified: Secondary | ICD-10-CM | POA: Diagnosis not present

## 2014-10-06 DIAGNOSIS — M25611 Stiffness of right shoulder, not elsewhere classified: Secondary | ICD-10-CM | POA: Diagnosis not present

## 2014-10-06 DIAGNOSIS — M25612 Stiffness of left shoulder, not elsewhere classified: Secondary | ICD-10-CM | POA: Diagnosis not present

## 2014-10-06 DIAGNOSIS — M25511 Pain in right shoulder: Secondary | ICD-10-CM | POA: Diagnosis not present

## 2014-10-06 DIAGNOSIS — M25512 Pain in left shoulder: Secondary | ICD-10-CM | POA: Diagnosis not present

## 2014-10-09 DIAGNOSIS — M25612 Stiffness of left shoulder, not elsewhere classified: Secondary | ICD-10-CM | POA: Diagnosis not present

## 2014-10-09 DIAGNOSIS — M25611 Stiffness of right shoulder, not elsewhere classified: Secondary | ICD-10-CM | POA: Diagnosis not present

## 2014-10-09 DIAGNOSIS — M25512 Pain in left shoulder: Secondary | ICD-10-CM | POA: Diagnosis not present

## 2014-10-09 DIAGNOSIS — M25511 Pain in right shoulder: Secondary | ICD-10-CM | POA: Diagnosis not present

## 2014-10-20 DIAGNOSIS — M25511 Pain in right shoulder: Secondary | ICD-10-CM | POA: Diagnosis not present

## 2014-10-20 DIAGNOSIS — M25612 Stiffness of left shoulder, not elsewhere classified: Secondary | ICD-10-CM | POA: Diagnosis not present

## 2014-10-20 DIAGNOSIS — M25611 Stiffness of right shoulder, not elsewhere classified: Secondary | ICD-10-CM | POA: Diagnosis not present

## 2014-10-20 DIAGNOSIS — M25512 Pain in left shoulder: Secondary | ICD-10-CM | POA: Diagnosis not present

## 2014-10-23 DIAGNOSIS — M25512 Pain in left shoulder: Secondary | ICD-10-CM | POA: Diagnosis not present

## 2014-10-23 DIAGNOSIS — M25612 Stiffness of left shoulder, not elsewhere classified: Secondary | ICD-10-CM | POA: Diagnosis not present

## 2014-10-23 DIAGNOSIS — M25611 Stiffness of right shoulder, not elsewhere classified: Secondary | ICD-10-CM | POA: Diagnosis not present

## 2014-10-23 DIAGNOSIS — M25511 Pain in right shoulder: Secondary | ICD-10-CM | POA: Diagnosis not present

## 2014-11-04 DIAGNOSIS — M25512 Pain in left shoulder: Secondary | ICD-10-CM | POA: Diagnosis not present

## 2014-11-04 DIAGNOSIS — M25612 Stiffness of left shoulder, not elsewhere classified: Secondary | ICD-10-CM | POA: Diagnosis not present

## 2014-11-04 DIAGNOSIS — M25611 Stiffness of right shoulder, not elsewhere classified: Secondary | ICD-10-CM | POA: Diagnosis not present

## 2014-11-04 DIAGNOSIS — M25511 Pain in right shoulder: Secondary | ICD-10-CM | POA: Diagnosis not present

## 2014-11-07 DIAGNOSIS — M25512 Pain in left shoulder: Secondary | ICD-10-CM | POA: Diagnosis not present

## 2014-11-07 DIAGNOSIS — M25612 Stiffness of left shoulder, not elsewhere classified: Secondary | ICD-10-CM | POA: Diagnosis not present

## 2014-11-07 DIAGNOSIS — M25611 Stiffness of right shoulder, not elsewhere classified: Secondary | ICD-10-CM | POA: Diagnosis not present

## 2014-11-07 DIAGNOSIS — M25511 Pain in right shoulder: Secondary | ICD-10-CM | POA: Diagnosis not present

## 2014-11-12 DIAGNOSIS — L570 Actinic keratosis: Secondary | ICD-10-CM | POA: Diagnosis not present

## 2014-12-22 DIAGNOSIS — D649 Anemia, unspecified: Secondary | ICD-10-CM | POA: Diagnosis not present

## 2014-12-22 DIAGNOSIS — I10 Essential (primary) hypertension: Secondary | ICD-10-CM | POA: Diagnosis not present

## 2014-12-22 DIAGNOSIS — R972 Elevated prostate specific antigen [PSA]: Secondary | ICD-10-CM | POA: Diagnosis not present

## 2014-12-22 DIAGNOSIS — E7800 Pure hypercholesterolemia, unspecified: Secondary | ICD-10-CM | POA: Diagnosis not present

## 2014-12-25 DIAGNOSIS — I10 Essential (primary) hypertension: Secondary | ICD-10-CM | POA: Diagnosis not present

## 2014-12-25 DIAGNOSIS — R972 Elevated prostate specific antigen [PSA]: Secondary | ICD-10-CM | POA: Diagnosis not present

## 2014-12-25 DIAGNOSIS — E7800 Pure hypercholesterolemia, unspecified: Secondary | ICD-10-CM | POA: Diagnosis not present

## 2014-12-25 DIAGNOSIS — Z23 Encounter for immunization: Secondary | ICD-10-CM | POA: Diagnosis not present

## 2015-01-02 DIAGNOSIS — H2512 Age-related nuclear cataract, left eye: Secondary | ICD-10-CM | POA: Diagnosis not present

## 2015-01-02 DIAGNOSIS — Z961 Presence of intraocular lens: Secondary | ICD-10-CM | POA: Diagnosis not present

## 2015-01-20 DIAGNOSIS — R05 Cough: Secondary | ICD-10-CM | POA: Diagnosis not present

## 2015-03-08 DIAGNOSIS — N289 Disorder of kidney and ureter, unspecified: Secondary | ICD-10-CM | POA: Diagnosis not present

## 2015-03-08 DIAGNOSIS — F99 Mental disorder, not otherwise specified: Secondary | ICD-10-CM | POA: Diagnosis not present

## 2015-03-08 DIAGNOSIS — I10 Essential (primary) hypertension: Secondary | ICD-10-CM | POA: Diagnosis not present

## 2015-03-08 DIAGNOSIS — M25511 Pain in right shoulder: Secondary | ICD-10-CM | POA: Diagnosis not present

## 2015-03-08 DIAGNOSIS — M7591 Shoulder lesion, unspecified, right shoulder: Secondary | ICD-10-CM | POA: Diagnosis not present

## 2015-03-08 DIAGNOSIS — M25811 Other specified joint disorders, right shoulder: Secondary | ICD-10-CM | POA: Diagnosis not present

## 2015-03-08 DIAGNOSIS — Z79899 Other long term (current) drug therapy: Secondary | ICD-10-CM | POA: Diagnosis not present

## 2015-03-08 DIAGNOSIS — M7581 Other shoulder lesions, right shoulder: Secondary | ICD-10-CM | POA: Diagnosis not present

## 2015-03-08 DIAGNOSIS — K219 Gastro-esophageal reflux disease without esophagitis: Secondary | ICD-10-CM | POA: Diagnosis not present

## 2015-03-08 DIAGNOSIS — G8929 Other chronic pain: Secondary | ICD-10-CM | POA: Diagnosis not present

## 2015-03-17 DIAGNOSIS — M25511 Pain in right shoulder: Secondary | ICD-10-CM | POA: Diagnosis not present

## 2015-04-10 DIAGNOSIS — R972 Elevated prostate specific antigen [PSA]: Secondary | ICD-10-CM | POA: Diagnosis not present

## 2015-04-10 DIAGNOSIS — E785 Hyperlipidemia, unspecified: Secondary | ICD-10-CM | POA: Diagnosis not present

## 2015-04-10 DIAGNOSIS — Z87442 Personal history of urinary calculi: Secondary | ICD-10-CM | POA: Diagnosis not present

## 2015-04-10 DIAGNOSIS — I129 Hypertensive chronic kidney disease with stage 1 through stage 4 chronic kidney disease, or unspecified chronic kidney disease: Secondary | ICD-10-CM | POA: Diagnosis not present

## 2015-04-10 DIAGNOSIS — N183 Chronic kidney disease, stage 3 (moderate): Secondary | ICD-10-CM | POA: Diagnosis not present

## 2015-06-22 DIAGNOSIS — I10 Essential (primary) hypertension: Secondary | ICD-10-CM | POA: Diagnosis not present

## 2015-06-25 DIAGNOSIS — R972 Elevated prostate specific antigen [PSA]: Secondary | ICD-10-CM | POA: Diagnosis not present

## 2015-06-25 DIAGNOSIS — I10 Essential (primary) hypertension: Secondary | ICD-10-CM | POA: Diagnosis not present

## 2015-06-25 DIAGNOSIS — E7800 Pure hypercholesterolemia, unspecified: Secondary | ICD-10-CM | POA: Diagnosis not present

## 2015-09-02 DIAGNOSIS — D485 Neoplasm of uncertain behavior of skin: Secondary | ICD-10-CM | POA: Diagnosis not present

## 2015-09-02 DIAGNOSIS — L570 Actinic keratosis: Secondary | ICD-10-CM | POA: Diagnosis not present

## 2015-09-02 DIAGNOSIS — C44612 Basal cell carcinoma of skin of right upper limb, including shoulder: Secondary | ICD-10-CM | POA: Diagnosis not present

## 2015-09-02 DIAGNOSIS — Z85828 Personal history of other malignant neoplasm of skin: Secondary | ICD-10-CM | POA: Diagnosis not present

## 2015-09-02 DIAGNOSIS — L821 Other seborrheic keratosis: Secondary | ICD-10-CM | POA: Diagnosis not present

## 2015-09-23 DIAGNOSIS — L570 Actinic keratosis: Secondary | ICD-10-CM | POA: Diagnosis not present

## 2015-09-25 DIAGNOSIS — N183 Chronic kidney disease, stage 3 (moderate): Secondary | ICD-10-CM | POA: Diagnosis not present

## 2015-09-25 DIAGNOSIS — I129 Hypertensive chronic kidney disease with stage 1 through stage 4 chronic kidney disease, or unspecified chronic kidney disease: Secondary | ICD-10-CM | POA: Diagnosis not present

## 2015-09-25 DIAGNOSIS — E785 Hyperlipidemia, unspecified: Secondary | ICD-10-CM | POA: Diagnosis not present

## 2015-09-25 DIAGNOSIS — R972 Elevated prostate specific antigen [PSA]: Secondary | ICD-10-CM | POA: Diagnosis not present

## 2015-10-26 DIAGNOSIS — C44612 Basal cell carcinoma of skin of right upper limb, including shoulder: Secondary | ICD-10-CM | POA: Diagnosis not present

## 2015-10-28 DIAGNOSIS — I10 Essential (primary) hypertension: Secondary | ICD-10-CM | POA: Diagnosis not present

## 2015-10-28 DIAGNOSIS — N289 Disorder of kidney and ureter, unspecified: Secondary | ICD-10-CM | POA: Diagnosis not present

## 2015-10-28 DIAGNOSIS — Z86010 Personal history of colonic polyps: Secondary | ICD-10-CM | POA: Diagnosis not present

## 2015-11-13 ENCOUNTER — Other Ambulatory Visit: Payer: Self-pay

## 2015-11-26 DIAGNOSIS — K635 Polyp of colon: Secondary | ICD-10-CM | POA: Diagnosis not present

## 2015-11-26 DIAGNOSIS — Z86010 Personal history of colonic polyps: Secondary | ICD-10-CM | POA: Diagnosis not present

## 2015-11-26 DIAGNOSIS — Z1211 Encounter for screening for malignant neoplasm of colon: Secondary | ICD-10-CM | POA: Diagnosis not present

## 2015-11-26 DIAGNOSIS — D122 Benign neoplasm of ascending colon: Secondary | ICD-10-CM | POA: Diagnosis not present

## 2015-12-14 DIAGNOSIS — Z86010 Personal history of colonic polyps: Secondary | ICD-10-CM | POA: Diagnosis not present

## 2015-12-14 DIAGNOSIS — D122 Benign neoplasm of ascending colon: Secondary | ICD-10-CM | POA: Diagnosis not present

## 2015-12-30 DIAGNOSIS — L570 Actinic keratosis: Secondary | ICD-10-CM | POA: Diagnosis not present

## 2015-12-30 DIAGNOSIS — C44612 Basal cell carcinoma of skin of right upper limb, including shoulder: Secondary | ICD-10-CM | POA: Diagnosis not present

## 2015-12-30 DIAGNOSIS — H0012 Chalazion right lower eyelid: Secondary | ICD-10-CM | POA: Diagnosis not present

## 2015-12-30 DIAGNOSIS — Z23 Encounter for immunization: Secondary | ICD-10-CM | POA: Diagnosis not present

## 2016-01-04 DIAGNOSIS — R972 Elevated prostate specific antigen [PSA]: Secondary | ICD-10-CM | POA: Diagnosis not present

## 2016-01-04 DIAGNOSIS — I10 Essential (primary) hypertension: Secondary | ICD-10-CM | POA: Diagnosis not present

## 2016-01-12 DIAGNOSIS — E7800 Pure hypercholesterolemia, unspecified: Secondary | ICD-10-CM | POA: Diagnosis not present

## 2016-01-12 DIAGNOSIS — I10 Essential (primary) hypertension: Secondary | ICD-10-CM | POA: Diagnosis not present

## 2016-01-12 DIAGNOSIS — N183 Chronic kidney disease, stage 3 (moderate): Secondary | ICD-10-CM | POA: Diagnosis not present

## 2016-01-12 DIAGNOSIS — R972 Elevated prostate specific antigen [PSA]: Secondary | ICD-10-CM | POA: Diagnosis not present

## 2016-04-21 DIAGNOSIS — E785 Hyperlipidemia, unspecified: Secondary | ICD-10-CM | POA: Diagnosis not present

## 2016-04-21 DIAGNOSIS — I129 Hypertensive chronic kidney disease with stage 1 through stage 4 chronic kidney disease, or unspecified chronic kidney disease: Secondary | ICD-10-CM | POA: Diagnosis not present

## 2016-04-21 DIAGNOSIS — N183 Chronic kidney disease, stage 3 (moderate): Secondary | ICD-10-CM | POA: Diagnosis not present

## 2016-04-21 DIAGNOSIS — R972 Elevated prostate specific antigen [PSA]: Secondary | ICD-10-CM | POA: Diagnosis not present

## 2016-05-06 DIAGNOSIS — R809 Proteinuria, unspecified: Secondary | ICD-10-CM | POA: Diagnosis not present

## 2016-06-08 DIAGNOSIS — R972 Elevated prostate specific antigen [PSA]: Secondary | ICD-10-CM | POA: Diagnosis not present

## 2016-06-08 DIAGNOSIS — N403 Nodular prostate with lower urinary tract symptoms: Secondary | ICD-10-CM | POA: Diagnosis not present

## 2016-06-08 DIAGNOSIS — N3281 Overactive bladder: Secondary | ICD-10-CM | POA: Diagnosis not present

## 2016-07-01 DIAGNOSIS — R972 Elevated prostate specific antigen [PSA]: Secondary | ICD-10-CM | POA: Diagnosis not present

## 2016-07-01 DIAGNOSIS — I10 Essential (primary) hypertension: Secondary | ICD-10-CM | POA: Diagnosis not present

## 2016-07-11 DIAGNOSIS — Z0000 Encounter for general adult medical examination without abnormal findings: Secondary | ICD-10-CM | POA: Diagnosis not present

## 2016-07-11 DIAGNOSIS — G8929 Other chronic pain: Secondary | ICD-10-CM | POA: Diagnosis not present

## 2016-07-11 DIAGNOSIS — M25512 Pain in left shoulder: Secondary | ICD-10-CM | POA: Diagnosis not present

## 2016-07-11 DIAGNOSIS — E7800 Pure hypercholesterolemia, unspecified: Secondary | ICD-10-CM | POA: Diagnosis not present

## 2016-07-11 DIAGNOSIS — I10 Essential (primary) hypertension: Secondary | ICD-10-CM | POA: Diagnosis not present

## 2016-07-26 ENCOUNTER — Other Ambulatory Visit: Payer: Self-pay | Admitting: Internal Medicine

## 2016-07-26 DIAGNOSIS — M25512 Pain in left shoulder: Secondary | ICD-10-CM

## 2016-07-26 DIAGNOSIS — G8929 Other chronic pain: Secondary | ICD-10-CM

## 2016-08-03 ENCOUNTER — Other Ambulatory Visit: Payer: Federal, State, Local not specified - PPO

## 2016-08-04 ENCOUNTER — Ambulatory Visit
Admission: RE | Admit: 2016-08-04 | Discharge: 2016-08-04 | Disposition: A | Discharge status: Home / Self Care | Payer: Medicare Other | Source: Ambulatory Visit | Attending: Internal Medicine | Admitting: Internal Medicine

## 2016-08-04 DIAGNOSIS — M25512 Pain in left shoulder: Secondary | ICD-10-CM

## 2016-08-04 DIAGNOSIS — G8929 Other chronic pain: Secondary | ICD-10-CM

## 2016-08-04 DIAGNOSIS — M19012 Primary osteoarthritis, left shoulder: Secondary | ICD-10-CM | POA: Diagnosis not present

## 2016-08-10 DIAGNOSIS — C61 Malignant neoplasm of prostate: Secondary | ICD-10-CM | POA: Diagnosis not present

## 2016-08-10 DIAGNOSIS — R972 Elevated prostate specific antigen [PSA]: Secondary | ICD-10-CM | POA: Diagnosis not present

## 2016-08-17 ENCOUNTER — Other Ambulatory Visit: Payer: Self-pay | Admitting: Urology

## 2016-08-17 DIAGNOSIS — C61 Malignant neoplasm of prostate: Secondary | ICD-10-CM

## 2016-08-23 ENCOUNTER — Telehealth: Payer: Self-pay | Admitting: Medical Oncology

## 2016-08-23 NOTE — Telephone Encounter (Signed)
Left a message requesting a return call to discuss referral to the Prostate MDC. 

## 2016-08-25 ENCOUNTER — Telehealth: Payer: Self-pay | Admitting: Medical Oncology

## 2016-08-25 ENCOUNTER — Encounter: Payer: Self-pay | Admitting: Medical Oncology

## 2016-08-25 NOTE — Progress Notes (Signed)
Spoke with Almyra Free at [facility] to request prostate biopsy slides for prostate MDC.

## 2016-09-02 ENCOUNTER — Encounter (HOSPITAL_COMMUNITY)
Admission: RE | Admit: 2016-09-02 | Discharge: 2016-09-02 | Disposition: A | Discharge status: Home / Self Care | Payer: Medicare Other | Source: Ambulatory Visit | Attending: Urology | Admitting: Urology

## 2016-09-02 DIAGNOSIS — C61 Malignant neoplasm of prostate: Secondary | ICD-10-CM

## 2016-09-02 MED ORDER — TECHNETIUM TC 99M MEDRONATE IV KIT
21.7000 | PACK | Freq: Once | INTRAVENOUS | Status: AC | PRN
  Administered 2016-09-02: 21.7 via INTRAVENOUS

## 2016-09-05 ENCOUNTER — Telehealth: Payer: Self-pay | Admitting: Medical Oncology

## 2016-09-05 NOTE — Telephone Encounter (Signed)
I called pt to introduce myself as the Prostate Nurse Navigator and the Coordinator of the Prostate [place].  1. I informed him of referral to the clinic 09/06/16 arriving at 12:30pm.  2. I discussed the format of the clinic and the physicians he will be seeing that day.  3. I discussed where the clinic is located and how to contact me.  4. I informed him I would be mailing a packet of information and forms to be completed. I asked him to bring them with him the day of his appointment.    I asked him to call me if he has any questions or concerns regarding his appointments or the forms he needs to complete.

## 2016-09-05 NOTE — Telephone Encounter (Signed)
Confirmed appointment for the Prostate [facility] 09/06/16 arriving at 12:30pm. We discussed the format of the clinic and they physicians he will see. I reviewed the process for registration and [place] parking. I asked him to please have lunch before arrival due to the length of the clinic. I reminded him to bring his completed medical forms. He voiced understanding of the above.

## 2016-09-06 ENCOUNTER — Encounter: Payer: Self-pay | Admitting: General Practice

## 2016-09-06 ENCOUNTER — Encounter: Payer: Self-pay | Admitting: Medical Oncology

## 2016-09-06 ENCOUNTER — Encounter: Payer: Self-pay | Admitting: Oncology

## 2016-09-06 ENCOUNTER — Ambulatory Visit (HOSPITAL_BASED_OUTPATIENT_CLINIC_OR_DEPARTMENT_OTHER): Payer: Medicare Other | Admitting: Oncology

## 2016-09-06 ENCOUNTER — Ambulatory Visit
Admission: RE | Admit: 2016-09-06 | Discharge: 2016-09-06 | Disposition: A | Discharge status: Home / Self Care | Payer: Medicare Other | Source: Ambulatory Visit | Attending: Radiation Oncology | Admitting: Radiation Oncology

## 2016-09-06 DIAGNOSIS — C61 Malignant neoplasm of prostate: Secondary | ICD-10-CM | POA: Insufficient documentation

## 2016-09-06 DIAGNOSIS — R972 Elevated prostate specific antigen [PSA]: Secondary | ICD-10-CM | POA: Diagnosis not present

## 2016-09-06 NOTE — Progress Notes (Signed)
Radiation Oncology         [phone] ________________________________  Multidisciplinary Prostate Cancer Clinic  Initial Radiation Oncology Consultation  Name: Darrell Hernandez. MRN: [id number]  Date: 09/06/2016  DOB: 08/10/1935  CC:Kim, Jeneen Rinks, MD  Darrell Bring, MD   REFERRING PHYSICIAN: [name], MD  DIAGNOSIS: 81 y.o. 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 Malignant neoplasm of prostate ([place]) Darrell M Darrell Bruhl. is a 81 y.o. gentleman.  He has a history of elevated PSA since 2013 and was referred to Dr. Diona Hernandez for evaluation with PSA of 4.3 at that time.  DRE was normal and he opted to be followed with serial PSA at that time and forego TRUSPBx. He was noted to have further elevation of his PSA of 6.14 by his primary care physician, Dr. Maudie Hernandez.  Accordingly, he was referred back to Dr. Diona Hernandez for evaluation on 06/08/16,  digital rectal examination was performed at that time revealing a 19mm left prostate nodule, mid-gland.  Repeat PSA 07/04/2016 was 4.7.  The patient proceeded to transrectal ultrasound with 12 biopsies of the prostate on 08/10/16.  The prostate volume measured 38.85 cc.  Out of 12 core biopsies,12 were positive.  The maximum Gleason score was 4+5, and this was seen in right mid-lateral and right apex.  Additionally, he had 4+4 in the right base, right base lateral and 3+5 in the left mid gland.  There was 4+3 in the right mid and left apex as well as 3+4 in the left base, left mid lateral, left apex lateral and left base.  He underwent CT A&P and bone scan on 09/02/16 for disease staging.  Both studies were negative for evidence of metastatic disease.  The patient reviewed the biopsy results with his urologist and he has kindly been referred today to the multidisciplinary prostate cancer clinic for presentation of pathology and radiology studies  in our conference for discussion of potential radiation treatment options and clinical evaluation.   PREVIOUS RADIATION THERAPY: No  PAST MEDICAL HISTORY:  has a past medical history of Prostate cancer ([place]).    PAST SURGICAL HISTORY:No past surgical history on file.  FAMILY HISTORY: family history is not on file.  SOCIAL HISTORY:    ALLERGIES: Other  MEDICATIONS:  Current Outpatient Prescriptions  Medication Sig Dispense Refill  . amLODipine (NORVASC) 10 MG tablet     . fosinopril (MONOPRIL) 40 MG tablet     . LORazepam (ATIVAN) 1 MG tablet     . metoprolol tartrate (LOPRESSOR) 25 MG tablet     . ranitidine (ZANTAC) 150 MG tablet Take 75 mg by mouth.    . TURMERIC PO Take by mouth.    . zinc gluconate 50 MG tablet Take 50 mg by mouth daily.     No current facility-administered medications for this encounter.     REVIEW OF SYSTEMS:  On review of systems, the patient reports that he is doing well overall. He denies any chest pain, shortness of breath, cough, fevers, chills, night sweats, unintended weight changes. He denies any bowel disturbances, and denies abdominal pain, nausea or vomiting. He denies any new musculoskeletal or joint aches or pains. His IPSS was 12, indicating moderate urinary symptoms. He has moderate ED, untreated. A complete review of systems is obtained and is otherwise negative.   PHYSICAL EXAM:  Wt Readings  from Last 3 Encounters:  09/06/16 138 lb 3.2 oz (62.7 kg)   Temp Readings from Last 3 Encounters:  09/06/16 98 F (36.7 C) (Oral)   BP Readings from Last 3 Encounters:  09/06/16 134/88   Pulse Readings from Last 3 Encounters:  09/06/16 63   Pain Assessment Pain Score: 0-No pain/10  In general this is a well appearing caucasian male in no acute distress. He is alert and oriented x4 and appropriate throughout the examination. HEENT reveals that the patient is normocephalic, atraumatic. EOMs are intact. PERRLA. Skin is intact without any  evidence of gross lesions. Cardiovascular exam reveals a regular rate and rhythm, no clicks rubs or murmurs are auscultated. Chest is clear to auscultation bilaterally. Lymphatic assessment is performed and does not reveal any adenopathy in the cervical, supraclavicular, axillary, or inguinal chains. Abdomen has active bowel sounds in all quadrants and is intact. The abdomen is soft, non tender, non distended. Lower extremities are negative for pretibial pitting edema, deep calf tenderness, cyanosis or clubbing.  KPS = 100  100 - Normal; no complaints; no evidence of disease. 90   - Able to carry on normal activity; minor signs or symptoms of disease. 80   - Normal activity with effort; some signs or symptoms of disease. 57   - Cares for self; unable to carry on normal activity or to do active work. 60   - Requires occasional assistance, but is able to care for most of his personal needs. 50   - Requires considerable assistance and frequent medical care. 11   - Disabled; requires special care and assistance. 56   - Severely disabled; hospital admission is indicated although death not imminent. 33   - Very sick; hospital admission necessary; active supportive treatment necessary. 10   - Moribund; fatal processes progressing rapidly. 0     - Dead  Karnofsky DA, Abelmann WH, Craver LS and Burchenal JH [phone]) The use of the nitrogen mustards in the palliative treatment of carcinoma: with particular reference to bronchogenic carcinoma Cancer 1 634-56   LABORATORY DATA:  No results found for: WBC, HGB, HCT, MCV, PLT No results found for: NA, K, CL, CO2 No results found for: ALT, AST, GGT, ALKPHOS, BILITOT   RADIOGRAPHY: Nm Bone Scan Whole Body  Result Date: 09/02/2016 CLINICAL DATA:  Prostate carcinoma. Recent Surgeries? noRecent trauma to bones? noProsthetics? No PSA = 4.7 07/04/16 EXAM: NUCLEAR MEDICINE WHOLE BODY BONE SCAN TECHNIQUE: Whole body anterior and posterior images were obtained  approximately 3 hours after intravenous injection of radiopharmaceutical. RADIOPHARMACEUTICALS:  21.7 mCi Technetium-71m MDP IV COMPARISON:  CT, 09/02/2016 at 10:03 a.m. FINDINGS: There are no areas of radiotracer localization to suggest metastatic disease to bone. There is uptake along the thoracolumbar spine, most evident at the L2-L3 disc level. This appears degenerative and, at L2-L3, corresponds to significant disc degenerative change noted on the current CT. Degenerative uptake is also evident at the first metatarsophalangeal joints bilaterally and involving both knees. Renal uptake is symmetric. IMPRESSION: 1. No evidence of metastatic disease to bone. Electronically Signed   By: Lajean Manes M.D.   On: 09/02/2016 14:22      IMPRESSION/PLAN: 81 y.o. gentleman with a high risk, stage T2a adenocarcinoma of the prostate with a PSA of 6.1 and a Gleason score of 4+5. We discussed the patient's workup and outlines the nature of prostate cancer in this setting. The patient's T stage, Gleason's score, and PSA put him into the high risk group. Accordingly, he  is eligible for a variety of potential treatment options including 8 weeks of external radiation, 5 weeks of EBRT followed by brachytherapy boost or prostatectomy.  At his age of 28, he is not an ideal candidate for prostatectomy. We discussed the available radiation techniques, and focused on the details and logistics and delivery. The patient is not an ideal candidate for EBRT with brachytherapy boost due to significant LUTS at present and high risk, high volume disease.  We discussed and outlined the risks, benefits, short and long-term effects associated with external beam radiotherapy and compared and contrasted these with prostatectomy. We also detailed the role of ADT in the treatment of high risk prostate cancer and outlined the associated side effects that could be expected with this therapy.  At the end of the conversation the patient is  interested in moving forward with LT ADT combined with 8 weeks of external radiotherapy. He has not received his first Lupron injection, and has not had placement of gold fiducial markers. I will follow up with the patient later this week to confirm his interest in proceeding with radiotherapy.  Contact patient on cell, as he will be in [place] caring for his critically ill son with brain injury. Best contact: [phone] or his wife, Pris [phone].  Once confirmed, we will contact [facility] to initiate ADT and then make arrangements for fiducial marker placement and SpaceOAR implant as an outpatient surgical procedure in late August 2018 prior to CT simulation. He will be scheduled for simulation following fiducial marker placement and SpaceOAR implant with plans to begin IMRT in early Sept. 2018. We will share our discussion with Dr. Diona Hernandez and move forward with scheduling the appropriate appointments in preparation for IMRT.  We spent 60 minutes face to face with the patient and more than 50% of that time was spent in counseling and/or coordination of care.    Nicholos Johns, PA-C    Tyler Pita, MD  [facility] Direct Dial: [phone]  Fax: [phone] [email or web address]  Skype  [email or web address]

## 2016-09-06 NOTE — Progress Notes (Signed)
[facility] Psychosocial Distress Screening Spiritual Care  Shadowed by counseling intern Becca Cash/MS, LPCA, [place], met with Darrell Hernandez and his wife Darrell Hernandez) in [facility] to introduce [facility] team/resources, reviewing distress screen per protocol.  The patient scored a 7 on the Psychosocial Distress Thermometer which indicates severe distress. Also assessed for distress and other psychosocial needs.   ONCBCN DISTRESS SCREENING 09/06/2016  Screening Type Initial Screening  Distress experienced in past week (1-10) 7  Family Problem type Children  Emotional problem type Nervousness/Anxiety  Spiritual/Religous concerns type Facing my mortality  Referral to support programs Yes   Per pt, prior to [facility], he experienced significant fear of advanced cancer, particularly because he has heard stories about unmanageable pain from mets.  Meeting with the team today was a relief!  Per couple, they feel much more optimistic and very cared for by the team.  In addition to dx, they have been struggling recently with pt's son's critical illness.  Per pt, his son [name], 22, was found unresponsive due to diabetic complications and is in ICU in [place]; they anticipate flying back to [place] soon and are very aware that son may not survive.  [place] team/resources.  We normalized feelings, provided emotional support, and  helped brainstorm responses to their questions about whether or not (or how/when/why) to share news of dx with others.  Also addressed grief specifically and noted that I provide grief support and spiritual direction as part of Spiritual Care.  [name] and Darrell welcomed opportunity to process and reflect with someone outside of their [employer] and community.   Follow up needed: Yes.  We plan for me to f/u by phone next week, leaving VM on pt's cell phone if he is unable to answer.  They are aware of ongoing Support Team availability and know to call as  needed/desired.  Please also page if immediate needs arise.  Thank you.   [place], [place], [facility] Pager [phone] Voicemail [phone]

## 2016-09-06 NOTE — Progress Notes (Signed)
                               Care Plan Summary  Name: Darrell Hernandez DOB: [birth year]   Your Medical Team:   Urologist -  Dr. [name], [facility]  Radiation Oncologist - Dr. Tyler Pita, [facility]   Medical Oncologist - Dr. Zola Button, [place]  Recommendations: 1) Androgen Deprivation (hormone injection) 2) Radiation  * These recommendations are based on information available as of today's consult.      Recommendations may change depending on the results of further tests or exams.  Next Steps: 1) Dr. Alan Ripper office will schedule hormone injection 2) Dr. Alan Ripper office will schedule gold makers and Space OAR 3) Dr. Johny Shears office will schedule the radiation  When appointments need to be scheduled, you will be contacted by [facility] and/or [facility].  Questions?  Please do not hesitate to call [name], RN, BSN, OCN at [phone] any questions or concerns.  [name] is your Oncology Nurse Navigator and is available to assist you while you're receiving your medical care at [facility].  Patient given business cards for all team members and a copy of "[place] Prevention Safety".

## 2016-09-06 NOTE — Consult Note (Signed)
Multi-Disciplinary Clinic 09/06/2016    Darrell Hernandez         MRN: [id number]  PRIMARY CARE:  Arlyss Repress, MD  DOB: 07-16-1935, 81 year old Male  REFERRING:  Lillette Boxer. Dahlstedt, MD  SSN: [id number]  PROVIDER:  Franchot Gallo, M.D.    TREATING:  [name], M.D.    LOCATION:  [facility] [phone]    CC/HPI: CC: Prostate Cancer   Physician requesting consult: Dr. Franchot Gallo  PCP: Dr. [name]  Location of consult: [facility] - Prostate Cancer Multidisciplinary Clinic   Darrell Hernandez is a 81 year old gentleman with a history of a mildly elevated PSA around 4-5. His PSA further increased to 6.1 and he was found to have a new left mid 5 mm prostate nodule prompting a TRUS biopsy of the prostate on 5/30 /18 indicting Gleason 4+5=9 adenocarcinoma with 12 out of 12 biopsy cores positive for malignancy.   Family history: None   Imaging studies: He has undergone staging evaluations with both a bone scan on 09/02/16 and a CT scan of the abdomen and pelvis on 09/02/16. There is no measurable evidence of metastatic disease.   PMH: He has a history of squamous cell carcinoma, anxiety, hypertension, asthma, GERD, and glaucoma.  PSH: Inguinal hernia repair.   TNM stage: cT2a Nx Mx  PSA: 6.1  Gleason score: 4+5=9  Biopsy (08/10/16): 12/12 cores positive  Left: L lateral apex (80%, 3+4=7), L apex (80%, 4+3=7, PNI), L lateral mid (70%, 3+4=7, PNI), L mid (50%, 3+5=8), L lateral base (40%, 3+4=7, PNI), L base (20%, 3+4=7)  Right: R apex (70%, 4+5=9), R lateral apex (10%, 4+4=8), R mid (40%, 4+3=7), R lateral mid (50%, 4+5=9, PNI), R base (30%, 4+4=8, PNI), R lateral base (20%, 4+4=8, PNI)  Prostate volume: 38.9 cc   Urinary function: IPSS is 12.  Erectile function: SHIM score is 14.     ALLERGIES: No Allergies    MEDICATIONS: Levaquin 750 mg tablet 1 tablet PO Morning of biopsy  Metoprolol Tartrate  AmLODIPine Besylate 5 MG Oral Tablet Oral    Centrum Silver TABS Oral  Fosinopril Sodium TABS 0 Oral  HydroCHLOROthiazide 12.5 MG Oral Tablet 0 Oral  Lorazepam 0.5 mg tablet 0 Oral  Paxil 20 MG Oral Tablet Oral  Zinc TABS Oral     GU PSH: Locm 300-399Mg /Ml Iodine,1Ml - 09/02/2016 Prostate Needle Biopsy - 08/10/2016      PSH Notes: Dermatological Surgery, Complete Colonoscopy, Cataract Surgery, Femur Repair, Hernia Repair, Hernia Repair, Eye Surgery, Grafts Bone   NON-GU PSH: Diagnostic Colonoscopy - 2014 Hernia Repair - 2013, 2013 Surgical Pathology, Gross And Microscopic Examination For Prostate Needle - 08/10/2016        GU PMH: Elevated PSA - 08/10/2016, - 06/08/2016, Elevated prostate specific antigen (PSA), - 2015 Overactive bladder (Stable) - 06/08/2016, Urgency-frequency syndrome, - 2015 Prostate nodule w/ LUTS - 06/08/2016 Unil Inguinal Hernia W/O obst or gang,non-recurrent (Stable) - 06/08/2016, Inguinal hernia, unilateral, - 2014 Chronic Kidney Disease Prostate Cancer      PMH Notes:  2011-09-19 13:14:18 - Note: Seizure  2011-09-19 13:14:18 - Note: Skin Cancer   NON-GU PMH: Encounter for general adult medical examination without abnormal findings, Encounter for preventive health examination - 2015 Personal history of malignant neoplasm of other organs and systems, History of squamous cell carcinoma - 2015 Anxiety, Anxiety (Symptom) - 2014 Asthma, Asthma - 2014 Cardiac murmur, unspecified, Murmurs - 2014 Personal history  of other diseases of the circulatory system, History of hypertension - 2012-06-21 Personal history of other diseases of the digestive system, History of esophageal reflux - 21-Jun-2012 Personal history of other diseases of the nervous system and sense organs, History of glaucoma - 06-21-2012    FAMILY HISTORY: Death In The Family Father - Runs In Family Death In The Family Mother - Runs In Family Family Health Status Number - Runs In Family Hypertension - Father Prostate Cancer - Father pulmonary embolism - Father    SOCIAL HISTORY: Marital Status: Married Current Smoking Status: Patient has never smoked.  <DIV'  Tobacco Use Assessment Completed:  Used Tobacco in last 30 days?   Drinks 3 drinks per week.  Drinks 1 caffeinated drink per day.     Notes: Former smoker, Retired From Work, Marital History - Currently Married, Caffeine Use, Alcohol Use   REVIEW OF SYSTEMS:     GU Review Male:  Patient denies frequent urination, hard to postpone urination, burning/ pain with urination, get up at night to urinate, leakage of urine, stream starts and stops, trouble starting your streams, and have to strain to urinate .    Gastrointestinal (Upper):  Patient denies nausea and vomiting.    Gastrointestinal (Lower):  Patient denies diarrhea and constipation.    Constitutional:  Patient denies fever, night sweats, weight loss, and fatigue.    Skin:  Patient denies skin rash/ lesion and itching.    Eyes:  Patient denies blurred vision and double vision.    Ears/ Nose/ Throat:  Patient denies sore throat and sinus problems.    Hematologic/Lymphatic:  Patient denies swollen glands and easy bruising.    Cardiovascular:  Patient denies leg swelling and chest pains.    Respiratory:  Patient denies cough and shortness of breath.    Endocrine:  Patient denies excessive thirst.    Musculoskeletal:  Patient denies back pain and joint pain.    Neurological:  Patient denies headaches and dizziness.    Psychologic:  Patient denies anxiety and depression.    VITAL SIGNS: None     MULTI-SYSTEM PHYSICAL EXAMINATION:      Constitutional: Well-nourished. No physical deformities. Normally developed. Good grooming.            PAST DATA REVIEWED:   Source Of History:  Patient  Lab Test Review:  PSA  Records Review:  Pathology Reports  X-Ray Review: C.T. Abdomen/Pelvis: Reviewed Films.  Bone Scan: Reviewed Films.      07/04/16 01/04/16 01/28/15 01/09/15 04/19/13 03/19/12 09/18/11  PSA  Total PSA 4.7 ng/dl 6.14 ng/dl 5.3  ng/dl 5.65 ng/dl 3.62  3.27  4.3   Notes     SPECIMEN TYPE: BLOOD TEST METHODOLOGY: ECLIA PSA (ELECTROCHEMILUMINESCENCE IMMUNOASSAY) SPECIMEN TYPE: BLOOD Test Methodology: ECLIA PSA (Electrochemiluminescence Immunoassay)     PROCEDURES: None   ASSESSMENT:     ICD-10 Details  1 GU:  Prostate Cancer - C61    PLAN:   Document  Letter(s):  Created for Patient: Clinical Summary   Notes:  1. Prostate cancer: We had a detailed discussion regarding his prostate cancer diagnosis and options for management and treatment. Considering the high risk nature of his disease, it was strongly recommended that he proceed with therapy of curative intent. It was agreed upon from the multidisciplinary clinic physicians that proceeding with long-term androgen deprivation therapy and external beam radiation therapy would be in his best interest. I did not recommend surgical therapy considering his advanced age.   The patient was counseled  about the natural history of prostate cancer and the standard treatment options that are available for prostate cancer. It was explained to him how his age and life expectancy, clinical stage, Gleason score, and PSA affect his prognosis, the decision to proceed with additional staging studies, as well as how that information influences recommended treatment strategies. We discussed the roles for active surveillance, radiation therapy, surgical therapy, androgen deprivation, as well as ablative therapy options for the treatment of prostate cancer as appropriate to his individual cancer situation. We discussed the risks and benefits of these options with regard to their impact on cancer control and also in terms of potential adverse events, complications, and impact on quality of life particularly related to urinary and sexual function. The patient was encouraged to ask questions throughout the discussion today and all questions were answered to his stated satisfaction. In addition, the  patient was provided with and/or directed to appropriate resources and literature for further education about prostate cancer and treatment options.   I will notify Dr. Diona Fanti of his decision so that he proceed with androgen deprivation therapy and have fiducial markers placed in anticipation of radiation therapy. He would like to begin radiation therapy in late September after a vacation that he and his wife are taking.   Cc: Dr. Franchot Gallo  Dr. [name]  Dr. Tyler Pita  Dr. Zola Button           E & M CODE: I spent at least 40 minutes face to face with the patient, more than 50% of that time was spent on counseling and/or coordinating care.

## 2016-09-06 NOTE — Progress Notes (Signed)
Reason for Referral: Prostate cancer.   HPI: 81 year old gentleman currently of [place] where he is living since 9. He is a gentleman in reasonable health and shape with a rising PSA since 2009. He is a PSA in July 2013 was 4.3 and he was referred subsequently to Dr. Diona Fanti and at that time his PSA was 4. 06/18/2016. He underwent a prostate biopsy on 08/10/2016 which showed a Gleason score 4+5 = 9 in at least 2 cores. All 12 cores are involved with high-volume disease. His staging workup did not reveal any evidence of metastatic disease including bone scan which showed no evidence of metastatic disease. He does not report any specific symptoms related to his prostate cancer. He denied any nocturia or frequency. He reports no arthralgias or myalgias. He does not report any bone pain or pathological fractures.  He does not report any headaches, blurry vision, syncope or seizures. He does not report any fevers or chills or sweats. He does not report any cough, wheezing, hemoptysis. He does not report any nausea or vomiting or abdominal pain. He does not report any petechia or rash. Beta review of systems unremarkable.   Past Medical History:  Diagnosis Date  . Prostate cancer ([place])   :  No past surgical history on file.:  No current outpatient prescriptions on file.:  Not on File:  No family history on file.:  Social History   Social History  . Marital status: Married    Spouse name: N/A  . Number of children: N/A  . Years of education: N/A   Occupational History  . Not on file.   Social History Main Topics  . Smoking status: Not on file  . Smokeless tobacco: Not on file  . Alcohol use Not on file  . Drug use: Unknown  . Sexual activity: Not on file   Other Topics Concern  . Not on file   Social History Narrative  . No narrative on file  :  Pertinent items are noted in HPI.  Exam: ECOG 0  General appearance: alert and cooperative without distress. Neck: no  adenopathy Back: negative Resp: clear to auscultation bilaterally Chest wall: no tenderness Cardio: regular rate and rhythm, S1, S2 normal, no murmur, click, rub or gallop GI: soft, non-tender; bowel sounds normal; no masses,  no organomegaly Extremities: extremities normal, atraumatic, no cyanosis or edema Skin: Skin color, texture, turgor normal. No rashes or lesions Lymph nodes: Cervical, supraclavicular, and axillary nodes normal.   Nm Bone Scan Whole Body  Result Date: 09/02/2016 CLINICAL DATA:  Prostate carcinoma. Recent Surgeries? noRecent trauma to bones? noProsthetics? No PSA = 4.7 07/04/16 EXAM: NUCLEAR MEDICINE WHOLE BODY BONE SCAN TECHNIQUE: Whole body anterior and posterior images were obtained approximately 3 hours after intravenous injection of radiopharmaceutical. RADIOPHARMACEUTICALS:  21.7 mCi Technetium-92m MDP IV COMPARISON:  CT, 09/02/2016 at 10:03 a.m. FINDINGS: There are no areas of radiotracer localization to suggest metastatic disease to bone. There is uptake along the thoracolumbar spine, most evident at the L2-L3 disc level. This appears degenerative and, at L2-L3, corresponds to significant disc degenerative change noted on the current CT. Degenerative uptake is also evident at the first metatarsophalangeal joints bilaterally and involving both knees. Renal uptake is symmetric. IMPRESSION: 1. No evidence of metastatic disease to bone. Electronically Signed   By: Lajean Manes M.D.   On: 09/02/2016 14:22    Assessment and Plan:   81 year old gentleman with prostate cancer diagnosed in May 2018. His Gleason score is 4+5 = 9  with high-volume disease including all 12 cores. His PSA was 4.7.  His case was discussed today the prostate cancer multidisciplinary clinic including pathology review as well as reviewing his radiographic images and bone scan. He does not appear to have metastatic disease but certainly high risk high-volume presentation. The natural course of this  disease was discussed today with the patient as well as treatment options. The best course of action for him would be definitive radiation therapy with long-term androgen deprivation for at least 16 months. Complications associated with hormone therapy was discussed today including weight gain, hot flashes and osteoporosis.  No additional systemic therapy is indicated at this time but he understands he is at high risk of developing systemic disease. If he develops metastatic disease in the future additional therapy will be warranted including systemic chemotherapy among others. These options are not curative at that time.

## 2016-09-07 ENCOUNTER — Encounter: Payer: Self-pay | Admitting: General Practice

## 2016-09-07 NOTE — Progress Notes (Signed)
[facility] Spiritual Care Note  Sent Mr Marcy a handwritten note of support, with encouragement to contact [facility] team at any time.   [place], [place], [facility] Pager [phone] Voicemail [phone]

## 2016-09-13 ENCOUNTER — Encounter: Payer: Self-pay | Admitting: General Practice

## 2016-09-13 DIAGNOSIS — C61 Malignant neoplasm of prostate: Secondary | ICD-10-CM | POA: Diagnosis not present

## 2016-09-13 NOTE — Progress Notes (Signed)
[facility] Spiritual Care Note  Followed up with Darrell Hernandez by phone to offer further emotional support.  He shared that his son Darrell Hernandez died in [place], [place] on 09-15-2022.  Per pt, his son had been in poor health for years.  The family was able to tell stories and bond around the bedside for five days while Darrell Hernandez was in ICU, which helped family process and grieve well.  Darrell Hernandez takes comfort in that gathered experience, the care from the [facility]'s staff "angels," and seeing his son peaceful and comfortable as he died.  We talked about how all of these experiences and elements of gratitude help with grieving and healing, even though they can be very hard in the moment.  Darrell Hernandez verbalized appreciation for bereavement support, noting that "everything you're saying resonates with me."  In addition to preparing for his first androgen deprivation shot today, he is also working on plans for a [employer], [employer], and other acts of mourning with Bob's adopted sister.  Darrell Hernandez describes himself as "doing as well as can be expected" and well supported by friends.  He is finding peace in everyday activities and their symbolism such as watering plants (soothing, sustaining).  He plans to keep in touch and values Spiritual Care as part of his team.  Darrell Hernandez, [facility] Pager [phone] Voicemail [phone]

## 2016-09-16 ENCOUNTER — Telehealth: Payer: Self-pay | Admitting: Medical Oncology

## 2016-09-16 NOTE — Telephone Encounter (Signed)
Left a message as follow up to Prostate MDC and to express my sympathy for the loss of his son. I asked him to call me with questions or concerns. I will continue to follow. He received Mills Koller 09/13/16.

## 2016-10-12 DIAGNOSIS — M25519 Pain in unspecified shoulder: Secondary | ICD-10-CM | POA: Diagnosis not present

## 2016-10-12 DIAGNOSIS — Z0000 Encounter for general adult medical examination without abnormal findings: Secondary | ICD-10-CM | POA: Diagnosis not present

## 2016-10-12 DIAGNOSIS — C61 Malignant neoplasm of prostate: Secondary | ICD-10-CM | POA: Diagnosis not present

## 2016-10-19 DIAGNOSIS — N3281 Overactive bladder: Secondary | ICD-10-CM | POA: Diagnosis not present

## 2016-10-19 DIAGNOSIS — C61 Malignant neoplasm of prostate: Secondary | ICD-10-CM | POA: Diagnosis not present

## 2016-11-07 ENCOUNTER — Other Ambulatory Visit: Payer: Self-pay | Admitting: Urology

## 2016-11-07 DIAGNOSIS — C61 Malignant neoplasm of prostate: Secondary | ICD-10-CM

## 2016-11-29 ENCOUNTER — Other Ambulatory Visit: Payer: Self-pay | Admitting: Urology

## 2016-11-29 ENCOUNTER — Encounter: Payer: Self-pay | Admitting: Urology

## 2016-11-29 DIAGNOSIS — C61 Malignant neoplasm of prostate: Secondary | ICD-10-CM

## 2016-11-29 NOTE — Progress Notes (Signed)
Patient started ADT with Mills Koller 09/13/16 and received 4 month Lupron 10/19/16.  He is scheduled for gold markers and SpaceOAR implant on 12/09/16 and CT SIM on 12/16/16.  He will need a prostate MRI following his Post-seed appointment for verification of [place] distribution.

## 2016-11-29 NOTE — Progress Notes (Signed)
Patient is having IMRT prostate- not brachytherapy.  Having gold seeds and SpaceOAR prior to CT SIM so will need prostate MRI to follow CT SIM for verification of SpaceOAR distribution and treatment planning purposes.

## 2016-12-06 ENCOUNTER — Encounter (HOSPITAL_BASED_OUTPATIENT_CLINIC_OR_DEPARTMENT_OTHER): Payer: Self-pay | Admitting: *Deleted

## 2016-12-07 ENCOUNTER — Encounter (HOSPITAL_BASED_OUTPATIENT_CLINIC_OR_DEPARTMENT_OTHER): Payer: Self-pay | Admitting: *Deleted

## 2016-12-07 NOTE — Progress Notes (Signed)
NPO AFTER MN.  ARRIVE AT 1000.  NEEDS ISTAT 8 AND EKG.  WILL TAKE ZANTAC AM DOS W/ SIPS OF WATER.

## 2016-12-09 ENCOUNTER — Ambulatory Visit (HOSPITAL_BASED_OUTPATIENT_CLINIC_OR_DEPARTMENT_OTHER): Payer: Medicare Other | Admitting: Anesthesiology

## 2016-12-09 ENCOUNTER — Ambulatory Visit (HOSPITAL_COMMUNITY): Payer: Medicare Other

## 2016-12-09 ENCOUNTER — Encounter (HOSPITAL_BASED_OUTPATIENT_CLINIC_OR_DEPARTMENT_OTHER): Payer: Self-pay | Admitting: *Deleted

## 2016-12-09 ENCOUNTER — Encounter (HOSPITAL_BASED_OUTPATIENT_CLINIC_OR_DEPARTMENT_OTHER)
Admission: RE | Disposition: A | Discharge status: Home / Self Care | Payer: Self-pay | Source: Ambulatory Visit | Attending: Urology

## 2016-12-09 ENCOUNTER — Ambulatory Visit (HOSPITAL_BASED_OUTPATIENT_CLINIC_OR_DEPARTMENT_OTHER)
Admission: RE | Admit: 2016-12-09 | Discharge: 2016-12-09 | Disposition: A | Discharge status: Home / Self Care | Payer: Medicare Other | Source: Ambulatory Visit | Attending: Urology | Admitting: Urology

## 2016-12-09 DIAGNOSIS — Z8546 Personal history of malignant neoplasm of prostate: Secondary | ICD-10-CM

## 2016-12-09 DIAGNOSIS — I10 Essential (primary) hypertension: Secondary | ICD-10-CM | POA: Diagnosis not present

## 2016-12-09 DIAGNOSIS — C61 Malignant neoplasm of prostate: Secondary | ICD-10-CM | POA: Insufficient documentation

## 2016-12-09 DIAGNOSIS — K219 Gastro-esophageal reflux disease without esophagitis: Secondary | ICD-10-CM | POA: Insufficient documentation

## 2016-12-09 HISTORY — PX: SPACE OAR INSTILLATION: SHX6769

## 2016-12-09 HISTORY — DX: Presence of spectacles and contact lenses: Z97.3

## 2016-12-09 HISTORY — DX: Benign prostatic hyperplasia with lower urinary tract symptoms: N40.1

## 2016-12-09 HISTORY — PX: GOLD SEED IMPLANT: SHX6343

## 2016-12-09 HISTORY — DX: Personal history of other specified conditions: Z87.898

## 2016-12-09 HISTORY — DX: Essential (primary) hypertension: I10

## 2016-12-09 HISTORY — DX: Gastro-esophageal reflux disease without esophagitis: K21.9

## 2016-12-09 LAB — POCT I-STAT, CHEM 8
BUN: 34 mg/dL — AB (ref 6–20)
CREATININE: 1.5 mg/dL — AB (ref 0.61–1.24)
Calcium, Ion: 1.26 mmol/L (ref 1.15–1.40)
Chloride: 105 mmol/L (ref 101–111)
Glucose, Bld: 99 mg/dL (ref 65–99)
HEMATOCRIT: 35 % — AB (ref 39.0–52.0)
HEMOGLOBIN: 11.9 g/dL — AB (ref 13.0–17.0)
POTASSIUM: 5.9 mmol/L — AB (ref 3.5–5.1)
Sodium: 141 mmol/L (ref 135–145)
TCO2: 28 mmol/L (ref 22–32)

## 2016-12-09 SURGERY — INSERTION, GOLD SEEDS
Anesthesia: General | Site: Prostate

## 2016-12-09 MED ORDER — LIDOCAINE 2% (20 MG/ML) 5 ML SYRINGE
INTRAMUSCULAR | Status: AC
  Filled 2016-12-09: qty 5

## 2016-12-09 MED ORDER — PROPOFOL 10 MG/ML IV BOLUS
INTRAVENOUS | Status: DC | PRN
  Administered 2016-12-09: 170 mg via INTRAVENOUS
  Administered 2016-12-09: 30 mg via INTRAVENOUS

## 2016-12-09 MED ORDER — GLYCOPYRROLATE 0.2 MG/ML IV SOSY
PREFILLED_SYRINGE | INTRAVENOUS | Status: AC
  Filled 2016-12-09: qty 5

## 2016-12-09 MED ORDER — FENTANYL CITRATE (PF) 100 MCG/2ML IJ SOLN
INTRAMUSCULAR | Status: DC | PRN
  Administered 2016-12-09: 25 ug via INTRAVENOUS

## 2016-12-09 MED ORDER — EPHEDRINE SULFATE-NACL 50-0.9 MG/10ML-% IV SOSY
PREFILLED_SYRINGE | INTRAVENOUS | Status: DC | PRN
  Administered 2016-12-09: 10 mg via INTRAVENOUS

## 2016-12-09 MED ORDER — CEFAZOLIN SODIUM-DEXTROSE 2-4 GM/100ML-% IV SOLN
INTRAVENOUS | Status: AC
  Filled 2016-12-09: qty 100

## 2016-12-09 MED ORDER — DEXAMETHASONE SODIUM PHOSPHATE 10 MG/ML IJ SOLN
INTRAMUSCULAR | Status: AC
  Filled 2016-12-09: qty 1

## 2016-12-09 MED ORDER — FENTANYL CITRATE (PF) 100 MCG/2ML IJ SOLN
25.0000 ug | INTRAMUSCULAR | Status: DC | PRN
  Filled 2016-12-09: qty 1

## 2016-12-09 MED ORDER — SODIUM CHLORIDE 0.9 % IV SOLN
INTRAVENOUS | Status: DC
  Administered 2016-12-09: 1000 mL via INTRAVENOUS
  Administered 2016-12-09: 11:00:00 via INTRAVENOUS
  Filled 2016-12-09: qty 1000

## 2016-12-09 MED ORDER — GLYCOPYRROLATE 0.2 MG/ML IV SOSY
PREFILLED_SYRINGE | INTRAVENOUS | Status: DC | PRN
  Administered 2016-12-09: .2 mg via INTRAVENOUS

## 2016-12-09 MED ORDER — ONDANSETRON HCL 4 MG/2ML IJ SOLN
INTRAMUSCULAR | Status: DC | PRN
Start: 2016-12-09 — End: 2016-12-09
  Administered 2016-12-09: 4 mg via INTRAVENOUS

## 2016-12-09 MED ORDER — ONDANSETRON HCL 4 MG/2ML IJ SOLN
INTRAMUSCULAR | Status: AC
  Filled 2016-12-09: qty 2

## 2016-12-09 MED ORDER — CEFAZOLIN SODIUM-DEXTROSE 2-4 GM/100ML-% IV SOLN
2.0000 g | INTRAVENOUS | Status: DC
  Filled 2016-12-09: qty 100

## 2016-12-09 MED ORDER — LACTATED RINGERS IV SOLN
INTRAVENOUS | Status: DC
  Filled 2016-12-09: qty 1000

## 2016-12-09 MED ORDER — DEXAMETHASONE SODIUM PHOSPHATE 10 MG/ML IJ SOLN
INTRAMUSCULAR | Status: DC | PRN
  Administered 2016-12-09: 5 mg via INTRAVENOUS

## 2016-12-09 MED ORDER — FENTANYL CITRATE (PF) 100 MCG/2ML IJ SOLN
INTRAMUSCULAR | Status: AC
Start: 2016-12-09 — End: 2016-12-09
  Filled 2016-12-09: qty 2

## 2016-12-09 MED ORDER — LIDOCAINE 2% (20 MG/ML) 5 ML SYRINGE
INTRAMUSCULAR | Status: DC | PRN
  Administered 2016-12-09: 100 mg via INTRAVENOUS

## 2016-12-09 MED ORDER — PROPOFOL 10 MG/ML IV BOLUS
INTRAVENOUS | Status: AC
  Filled 2016-12-09: qty 20

## 2016-12-09 MED ORDER — EPHEDRINE 5 MG/ML INJ
INTRAVENOUS | Status: AC
  Filled 2016-12-09: qty 10

## 2016-12-09 MED ORDER — SODIUM CHLORIDE 0.9 % IJ SOLN
INTRAMUSCULAR | Status: DC | PRN
  Administered 2016-12-09: 10 mL

## 2016-12-09 SURGICAL SUPPLY — 23 items
COVER BACK TABLE 60X90IN (DRAPES) ×3 IMPLANT
DRAPE LG THREE QUARTER DISP (DRAPES) IMPLANT
DRAPE UNDERBUTTOCKS STRL (DRAPE) IMPLANT
DRSG TEGADERM 4X4.75 (GAUZE/BANDAGES/DRESSINGS) ×3 IMPLANT
DRSG TEGADERM 8X12 (GAUZE/BANDAGES/DRESSINGS) ×3 IMPLANT
DRSG TELFA 3X8 NADH (GAUZE/BANDAGES/DRESSINGS) ×3 IMPLANT
GLOVE BIO SURGEON STRL SZ8 (GLOVE) ×3 IMPLANT
IMPL SPACEOAR SYSTEM 10ML (MISCELLANEOUS) ×1 IMPLANT
IMPLANT SPACEOAR SYSTEM 10ML (MISCELLANEOUS) ×3
KIT RM TURNOVER CYSTO AR (KITS) ×3 IMPLANT
LEGGING LITHOTOMY PAIR STRL (DRAPES) IMPLANT
MARKER GOLD PRELOAD 1.2X3 (Urological Implant) ×3 IMPLANT
NDL SAFETY ECLIPSE 18X1.5 (NEEDLE) IMPLANT
NEEDLE HYPO 18GX1.5 SHARP (NEEDLE)
NEEDLE SPNL 22GX7 QUINCKE BK (NEEDLE) ×3 IMPLANT
PACK CYSTO (CUSTOM PROCEDURE TRAY) ×3 IMPLANT
SEED GOLD PRELOAD 1.2X3 (Urological Implant) ×9 IMPLANT
SURGILUBE 2OZ TUBE FLIPTOP (MISCELLANEOUS) ×3 IMPLANT
SYR 20CC LL (SYRINGE) IMPLANT
SYR CONTROL 10ML LL (SYRINGE) ×3 IMPLANT
TOWEL OR 17X24 6PK STRL BLUE (TOWEL DISPOSABLE) ×3 IMPLANT
UNDERPAD 30X30 INCONTINENT (UNDERPADS AND DIAPERS) ×6 IMPLANT
WATER STERILE IRR 500ML POUR (IV SOLUTION) ×3 IMPLANT

## 2016-12-09 NOTE — Anesthesia Preprocedure Evaluation (Addendum)
Anesthesia Evaluation  Patient identified by MRN, date of birth, ID band Patient awake    Reviewed: Allergy & Precautions, NPO status , Patient's Chart, lab work & pertinent test results  Airway Mallampati: II  TM Distance: >3 FB Neck ROM: Full    Dental  (+) Dental Advisory Given   Pulmonary neg pulmonary ROS,    breath sounds clear to auscultation       Cardiovascular hypertension, Pt. on medications and Pt. on home beta blockers  Rhythm:Regular Rate:Normal     Neuro/Psych negative neurological ROS     GI/Hepatic Neg liver ROS, GERD  Medicated,  Endo/Other  negative endocrine ROS  Renal/GU negative Renal ROS     Musculoskeletal   Abdominal   Peds  Hematology negative hematology ROS (+)   Anesthesia Other Findings   Reproductive/Obstetrics                            Anesthesia Physical Anesthesia Plan  ASA: II  Anesthesia Plan: General   Post-op Pain Management:    Induction: Intravenous  PONV Risk Score and Plan: 2 and Ondansetron, Dexamethasone and Treatment may vary due to age or medical condition  Airway Management Planned: LMA  Additional Equipment:   Intra-op Plan:   Post-operative Plan: Extubation in OR  Informed Consent: I have reviewed the patients History and Physical, chart, labs and discussed the procedure including the risks, benefits and alternatives for the proposed anesthesia with the patient or authorized representative who has indicated his/her understanding and acceptance.   Dental advisory given  Plan Discussed with: CRNA  Anesthesia Plan Comments:         Anesthesia Quick Evaluation

## 2016-12-09 NOTE — Progress Notes (Signed)
Foley catheter care instructions reviewed and given to patient and spouse.

## 2016-12-09 NOTE — Anesthesia Postprocedure Evaluation (Signed)
Anesthesia Post Note  Patient: Darrell Hernandez.  Procedure(s) Performed: Procedure(s) (LRB): GOLD SEED IMPLANT (N/A) SPACE OAR INSTILLATION (N/A)     Patient location during evaluation: PACU Anesthesia Type: General Level of consciousness: awake and alert Pain management: pain level controlled Vital Signs Assessment: post-procedure vital signs reviewed and stable Respiratory status: spontaneous breathing, nonlabored ventilation and respiratory function stable Cardiovascular status: blood pressure returned to baseline and stable Postop Assessment: no apparent nausea or vomiting Anesthetic complications: no    Last Vitals:  Vitals:   12/09/16 1315 12/09/16 1330  BP: (!) 153/95 (!) 160/92  Pulse: 85 81  Resp: 15 15  Temp:    SpO2: 98% 100%    Last Pain:  Vitals:   12/09/16 1004  TempSrc: Oral                 Lynda Rainwater

## 2016-12-09 NOTE — Transfer of Care (Signed)
Immediate Anesthesia Transfer of Care Note  Patient: Darrell Hernandez.  Procedure(s) Performed: Procedure(s): GOLD SEED IMPLANT (N/A) SPACE OAR INSTILLATION (N/A)  Patient Location: PACU  Anesthesia Type:General  Level of Consciousness: sedated and responds to stimulation  Airway & Oxygen Therapy: Patient Spontanous Breathing and Patient connected to nasal cannula oxygen  Post-op Assessment: Report given to RN  Post vital signs: Reviewed and stable  Last Vitals:  Vitals:   12/09/16 1004 12/09/16 1242  BP: (!) 154/82 119/81  Pulse: 60 86  Resp: 19   Temp: (!) 36.4 C 36.5 C  SpO2: 100% 100%    Last Pain:  Vitals:   12/09/16 1004  TempSrc: Oral      Patients Stated Pain Goal: 5 (88/33/74 4514)  Complications: No apparent anesthesia complications

## 2016-12-09 NOTE — H&P (Signed)
H&P  Chief Complaint: prostate cancer  History of Present Illness: 81 year old male with high-risk prostate cancer.  He has initiated androgen deprivation therapy.  He is scheduled to commence with external beam radiotherapy in the near future.  He presents at this time for placement of gold fiducial markers as well as placement of Space OAR.  Past Medical History:  Diagnosis Date  . GERD (gastroesophageal reflux disease)   . History of cardiac murmur as a child   . History of idiopathic seizure    per pt age 8 or 67 had 2 seizure's , unknown cause, and no seizure's since  . Hyperplasia of prostate with lower urinary tract symptoms (LUTS)   . Hypertension   . Prostate cancer ([place]) UROLOGIST-  DR Benny Henrie/  ONCOLOGIST-  DR MANNING/ DR Alen Blew   dx 08-10-2016 (bx)  Stage T2a,  Gleason 4+5,  PSA 6.1,  vol 38.85cc--- plan external beam radiation  . Wears glasses     Past Surgical History:  Procedure Laterality Date  . CATARACT EXTRACTION W/ INTRAOCULAR LENS IMPLANT Right 2015  . COLONOSCOPY  last one 2017  . EYE SURGERY Right 2010   removal epiretinal membrane  . INGUINAL HERNIA REPAIR Bilateral 1980s;  1990  . TONSILLECTOMY  child    Home Medications:  Allergies as of 12/09/2016      Reactions   Other Shortness Of Breath, Swelling   ALL NUTS (THE MOST SEVERE IS PEANUTS)      Medication List    Notice   Cannot display discharge medications because the patient has not yet been admitted.     Allergies:  Allergies  Allergen Reactions  . Other Shortness Of Breath and Swelling    ALL NUTS (THE MOST SEVERE IS PEANUTS)    History reviewed. No pertinent family history.  Social History:  reports that he has never smoked. He has never used smokeless tobacco. He reports that he drinks about 1.2 oz of alcohol per week . He reports that he does not use drugs.  ROS: A complete review of systems was performed.  All systems are negative except for pertinent findings as  noted.  Physical Exam:  Vital signs in last 24 hours:   Constitutional:  Alert and oriented, No acute distress Cardiovascular: Regular rate and rhythm, No JVD Respiratory: Normal respiratory effort, Lungs clear bilaterally GI: Abdomen is soft, nontender, nondistended, no abdominal masses Genitourinary: No CVAT. Normal male phallus, testes are descended bilaterally and non-tender and without masses, scrotum is normal in appearance without lesions or masses, perineum is normal on inspection. Rectal: Normal sphincter tone, no rectal masses, prostate is non tender and without nodularity. Prostate size is estimated to be 40 cc Lymphatic: No lymphadenopathy Neurologic: Grossly intact, no focal deficits Psychiatric: Normal mood and affect  Laboratory Data:  No results for input(s): WBC, HGB, HCT, PLT in the last 72 hours.  No results for input(s): NA, K, CL, GLUCOSE, BUN, CALCIUM, CREATININE in the last 72 hours.  Invalid input(s): CO3   No results found for this or any previous visit (from the past 24 hour(s)). No results found for this or any previous visit (from the past 240 hour(s)).  Renal Function: No results for input(s): CREATININE in the last 168 hours. CrCl cannot be calculated (No order found.).  Radiologic Imaging: No results found.  Impression/Assessment:  High-risk adenocarcinoma of the prostate.  He has initiated androgen deprivation therapy.  He will initiate external beam radiotherapy in the near future.  Plan:  Placement  of gold fiducial markers 3, placement of Space OAR

## 2016-12-09 NOTE — Interval H&P Note (Signed)
History and Physical Interval Note:  12/09/2016 12:03 PM  Darrell Hernandez.  has presented today for surgery, with the diagnosis of PROSTATE CANCER  The various methods of treatment have been discussed with the patient and family. After consideration of risks, benefits and other options for treatment, the patient has consented to  Procedure(s): GOLD SEED IMPLANT (N/A) SPACE OAR INSTILLATION (N/A) as a surgical intervention .  The patient's history has been reviewed, patient examined, no change in status, stable for surgery.  I have reviewed the patient's chart and labs.  Questions were answered to the patient's satisfaction.     Jorja Loa

## 2016-12-09 NOTE — Progress Notes (Signed)
Pt informed of order from Dr. Vernie Shanks to insert foley catheter and have voiding trial on Monday.  Pt prefers to wait a little long and drink water. Water given to patient and IV fluids continue to infuse. Will continue to monitor, and bladder scan again.

## 2016-12-09 NOTE — Anesthesia Procedure Notes (Signed)
Procedure Name: LMA Insertion Date/Time: 12/09/2016 12:14 PM Performed by: Bethena Roys T Pre-anesthesia Checklist: Patient identified, Emergency Drugs available, Suction available and Patient being monitored Patient Re-evaluated:Patient Re-evaluated prior to induction Oxygen Delivery Method: Circle system utilized Preoxygenation: Pre-oxygenation with 100% oxygen Induction Type: IV induction Ventilation: Mask ventilation without difficulty LMA: LMA inserted LMA Size: 5.0 Number of attempts: 2 Airway Equipment and Method: Bite block Placement Confirmation: positive ETCO2 Tube secured with: Tape Dental Injury: Teeth and Oropharynx as per pre-operative assessment

## 2016-12-09 NOTE — Discharge Instructions (Signed)
Home Care Instructions   Activity:    Rest for the remainder of the day.  Do not drive or operate equipment today.   Meals:  use judgmentin advancing diet.  Return To Work: You may return to work as instructed by Naval architect.   Post Anesthesia Home Care Instructions  Activity: Get plenty of rest for the remainder of the day. A responsible individual must stay with you for 24 hours following the procedure.  For the next 24 hours, DO NOT: -Drive a car -Paediatric nurse -Drink alcoholic beverages -Take any medication unless instructed by your physician -Make any legal decisions or sign important papers.  Meals: Start with liquid foods such as gelatin or soup. Progress to regular foods as tolerated. Avoid greasy, spicy, heavy foods. If nausea and/or vomiting occur, drink only clear liquids until the nausea and/or vomiting subsides. Call your physician if vomiting continues.  Special Instructions/Symptoms: Your throat may feel dry or sore from the anesthesia or the breathing tube placed in your throat during surgery. If this causes discomfort, gargle with warm salt water. The discomfort should disappear within 24 hours.  If you had a scopolamine patch placed behind your ear for the management of post- operative nausea and/or vomiting:  1. The medication in the patch is effective for 72 hours, after which it should be removed.  Wrap patch in a tissue and discard in the trash. Wash hands thoroughly with soap and water. 2. You may remove the patch earlier than 72 hours if you experience unpleasant side effects which may include dry mouth, dizziness or visual disturbances. 3. Avoid touching the patch. Wash your hands with soap and water after contact with the patch.       Call your physician if any of these symptoms occur:   Persistent or heavy bleeding  Urine stream diminishes or stops completely after catheter is removed  Fever equal to or greater than 101 degrees  F  Cloudy urine with a strong foul odor  Severe pain

## 2016-12-09 NOTE — Op Note (Addendum)
Preoperative diagnosis: Clinical stage TI C adenocarcinoma the prostate   Postoperative diagnosis: Same   Procedure: transrectal ultrasound, ultrasound guidance of placement of fiducial markers, placement of Space OAR  Surgeon: Cieanna Stormes  Anesthesia: general  Complications: None.  Specimen: None  Indication: 81 year old male with high-risk prostate cancer.  He has initiated androgen that the patient therapy.  He presents at this time for anesthetic placement of fiducial markers as well as placement of space oar   Technique and findings: Patient was brought the operating room where he had successful induction of general anesthesia. He was placed in dorso-lithotomy position and prepped and draped in usual manner. Appropriate surgical timeout was performed..  The transrectal ultrasound probe was passed into the patient's rectum.  Appropriate scanning was performed.  Following this, 3 fiducial markers were placed.  The first marker was placed in the right prostatic base just off the midline.  The second marker was placed at the right apex, again just off the midline.  The third marker was placed in the left mid lateral prostate.  All seeds were passed approximately 1 centimeter into the posterior edge of the prostate. Transperineal approach was used for all of these. I then proceeded with placement of SpaceOARby introducing a needle with the bevel angled inferiorly approximately 2 cm superior to the anus. This was angled downward and under direct ultrasound was placed within the space between the prostatic capsule and rectum. This was confirmed with a small amount of sterile saline injected and this was performed under direct ultrasound. I then attached the SpaceOARto the needle and injected this in the space between the prostate and rectum with good placement noted. The patient was brought to recovery room in stable condition, having tolerated the procedure well.

## 2016-12-12 ENCOUNTER — Encounter (HOSPITAL_BASED_OUTPATIENT_CLINIC_OR_DEPARTMENT_OTHER): Payer: Self-pay | Admitting: Urology

## 2016-12-13 DIAGNOSIS — N3281 Overactive bladder: Secondary | ICD-10-CM | POA: Diagnosis not present

## 2016-12-16 ENCOUNTER — Encounter: Payer: Self-pay | Admitting: Medical Oncology

## 2016-12-16 ENCOUNTER — Ambulatory Visit
Admission: RE | Admit: 2016-12-16 | Discharge: 2016-12-16 | Disposition: A | Discharge status: Home / Self Care | Payer: Medicare Other | Source: Ambulatory Visit | Attending: Radiation Oncology | Admitting: Radiation Oncology

## 2016-12-16 ENCOUNTER — Ambulatory Visit (HOSPITAL_COMMUNITY)
Admission: RE | Admit: 2016-12-16 | Discharge: 2016-12-16 | Disposition: A | Discharge status: Home / Self Care | Payer: Medicare Other | Source: Ambulatory Visit | Attending: Urology | Admitting: Urology

## 2016-12-16 DIAGNOSIS — C61 Malignant neoplasm of prostate: Secondary | ICD-10-CM

## 2016-12-16 DIAGNOSIS — Z510 Encounter for antineoplastic radiation therapy: Secondary | ICD-10-CM | POA: Insufficient documentation

## 2016-12-16 NOTE — Progress Notes (Signed)
  Radiation Oncology         [phone]) [phone] ________________________________  Name: Darrell Hernandez. MRN: [id number]  Date: 12/16/2016  DOB: Feb 11, 1936  SIMULATION AND TREATMENT PLANNING NOTE  No diagnosis found.  DIAGNOSIS:  81 y.o. gentleman with stage T2a adenocarcinoma of the prostate with a Gleason's score of 4+5 and a PSA of 6.1  NARRATIVE:  The patient was brought to the [place].  Identity was confirmed.  All relevant records and images related to the planned course of therapy were reviewed.  The patient freely provided informed written consent to proceed with treatment after reviewing the details related to the planned course of therapy. The consent form was witnessed and verified by the simulation staff.  Then, the patient was set-up in a stable reproducible supine position for radiation therapy.  A vacuum lock pillow device was custom fabricated to position his legs in a reproducible immobilized position.  Then, I performed a urethrogram under sterile conditions to identify the prostatic apex.  CT images were obtained.  Surface markings were placed.  The CT images were loaded into the planning software.  An MRI was fused in to visualize the [place].  Then the prostate target and avoidance structures including the rectum, bladder, bowel and hips were contoured.  Treatment planning then occurred.  The radiation prescription was entered and confirmed.  A total of one complex treatment devices were fabricated. I have requested : Intensity Modulated Radiotherapy (IMRT) is medically necessary for this case for the following reason:  Rectal sparing.Marland Kitchen  PLAN:  The patient will receive 45 Gy in 25 fractions of 1.8 Gy, followed by a boost to the prostate to a total dose of 75 Gy with 15 additional fractions of 2.0 Gy.  ________________________________  Sheral Apley Tammi Klippel, M.D.  This document serves as a record of services personally performed by Tyler Pita, MD. It was  created on his behalf by Rae Lips, a trained medical scribe. The creation of this record is based on the scribe's personal observations and the provider's statements to them. This document has been checked and approved by the attending provider.

## 2016-12-16 NOTE — Progress Notes (Signed)
Mr. Darrell Hernandez here today for CT simulation. He received his androgen deprivation 09/13/16. Radiation was dicussed in detail and all questions were answered. He will begin treatment 12/27/16. He states that he has been doing well except his wife has been very ill and  was hospitalized with a virus. She is improving and is with him today. I will continue to follow and asked him to call me with questions or concerns.

## 2016-12-19 ENCOUNTER — Ambulatory Visit (HOSPITAL_COMMUNITY): Payer: Medicare Other

## 2016-12-22 DIAGNOSIS — C61 Malignant neoplasm of prostate: Secondary | ICD-10-CM | POA: Diagnosis not present

## 2016-12-22 DIAGNOSIS — Z510 Encounter for antineoplastic radiation therapy: Secondary | ICD-10-CM | POA: Diagnosis not present

## 2016-12-27 ENCOUNTER — Ambulatory Visit: Payer: Medicare Other | Admitting: Radiation Oncology

## 2016-12-28 ENCOUNTER — Ambulatory Visit
Admission: RE | Admit: 2016-12-28 | Discharge: 2016-12-28 | Disposition: A | Discharge status: Home / Self Care | Payer: Medicare Other | Source: Ambulatory Visit | Attending: Radiation Oncology | Admitting: Radiation Oncology

## 2016-12-28 DIAGNOSIS — Z510 Encounter for antineoplastic radiation therapy: Secondary | ICD-10-CM | POA: Diagnosis not present

## 2016-12-28 DIAGNOSIS — C61 Malignant neoplasm of prostate: Secondary | ICD-10-CM | POA: Diagnosis not present

## 2016-12-29 ENCOUNTER — Ambulatory Visit
Admission: RE | Admit: 2016-12-29 | Discharge: 2016-12-29 | Disposition: A | Discharge status: Home / Self Care | Payer: Medicare Other | Source: Ambulatory Visit | Attending: Radiation Oncology | Admitting: Radiation Oncology

## 2016-12-29 DIAGNOSIS — Z510 Encounter for antineoplastic radiation therapy: Secondary | ICD-10-CM | POA: Diagnosis not present

## 2016-12-29 DIAGNOSIS — C61 Malignant neoplasm of prostate: Secondary | ICD-10-CM | POA: Diagnosis not present

## 2016-12-30 ENCOUNTER — Ambulatory Visit
Admission: RE | Admit: 2016-12-30 | Discharge: 2016-12-30 | Disposition: A | Discharge status: Home / Self Care | Payer: Medicare Other | Source: Ambulatory Visit | Attending: Radiation Oncology | Admitting: Radiation Oncology

## 2016-12-30 ENCOUNTER — Other Ambulatory Visit: Payer: Self-pay | Admitting: Radiation Oncology

## 2016-12-30 DIAGNOSIS — C61 Malignant neoplasm of prostate: Secondary | ICD-10-CM | POA: Diagnosis not present

## 2016-12-30 DIAGNOSIS — Z510 Encounter for antineoplastic radiation therapy: Secondary | ICD-10-CM | POA: Diagnosis not present

## 2016-12-30 MED ORDER — TAMSULOSIN HCL 0.4 MG PO CAPS: 0.4000 mg | ORAL_CAPSULE | Freq: Every day | ORAL | 5 refills | Status: DC

## 2017-01-02 ENCOUNTER — Ambulatory Visit
Admission: RE | Admit: 2017-01-02 | Discharge: 2017-01-02 | Disposition: A | Discharge status: Home / Self Care | Payer: Medicare Other | Source: Ambulatory Visit | Attending: Radiation Oncology | Admitting: Radiation Oncology

## 2017-01-02 DIAGNOSIS — Z510 Encounter for antineoplastic radiation therapy: Secondary | ICD-10-CM | POA: Diagnosis not present

## 2017-01-02 DIAGNOSIS — C61 Malignant neoplasm of prostate: Secondary | ICD-10-CM | POA: Diagnosis not present

## 2017-01-03 ENCOUNTER — Ambulatory Visit
Admission: RE | Admit: 2017-01-03 | Discharge: 2017-01-03 | Disposition: A | Discharge status: Home / Self Care | Payer: Medicare Other | Source: Ambulatory Visit | Attending: Radiation Oncology | Admitting: Radiation Oncology

## 2017-01-03 DIAGNOSIS — C61 Malignant neoplasm of prostate: Secondary | ICD-10-CM | POA: Diagnosis not present

## 2017-01-03 DIAGNOSIS — Z510 Encounter for antineoplastic radiation therapy: Secondary | ICD-10-CM | POA: Diagnosis not present

## 2017-01-03 DIAGNOSIS — Z23 Encounter for immunization: Secondary | ICD-10-CM | POA: Diagnosis not present

## 2017-01-04 ENCOUNTER — Ambulatory Visit
Admission: RE | Admit: 2017-01-04 | Discharge: 2017-01-04 | Disposition: A | Discharge status: Home / Self Care | Payer: Medicare Other | Source: Ambulatory Visit | Attending: Radiation Oncology | Admitting: Radiation Oncology

## 2017-01-04 DIAGNOSIS — Z510 Encounter for antineoplastic radiation therapy: Secondary | ICD-10-CM | POA: Diagnosis not present

## 2017-01-04 DIAGNOSIS — C61 Malignant neoplasm of prostate: Secondary | ICD-10-CM | POA: Diagnosis not present

## 2017-01-05 ENCOUNTER — Ambulatory Visit
Admission: RE | Admit: 2017-01-05 | Discharge: 2017-01-05 | Disposition: A | Discharge status: Home / Self Care | Payer: Medicare Other | Source: Ambulatory Visit | Attending: Radiation Oncology | Admitting: Radiation Oncology

## 2017-01-05 DIAGNOSIS — C61 Malignant neoplasm of prostate: Secondary | ICD-10-CM | POA: Diagnosis not present

## 2017-01-05 DIAGNOSIS — Z510 Encounter for antineoplastic radiation therapy: Secondary | ICD-10-CM | POA: Diagnosis not present

## 2017-01-06 ENCOUNTER — Ambulatory Visit
Admission: RE | Admit: 2017-01-06 | Discharge: 2017-01-06 | Disposition: A | Discharge status: Home / Self Care | Payer: Medicare Other | Source: Ambulatory Visit | Attending: Radiation Oncology | Admitting: Radiation Oncology

## 2017-01-06 DIAGNOSIS — C61 Malignant neoplasm of prostate: Secondary | ICD-10-CM | POA: Diagnosis not present

## 2017-01-06 DIAGNOSIS — Z510 Encounter for antineoplastic radiation therapy: Secondary | ICD-10-CM | POA: Diagnosis not present

## 2017-01-09 ENCOUNTER — Ambulatory Visit
Admission: RE | Admit: 2017-01-09 | Discharge: 2017-01-09 | Disposition: A | Discharge status: Home / Self Care | Payer: Medicare Other | Source: Ambulatory Visit | Attending: Radiation Oncology | Admitting: Radiation Oncology

## 2017-01-09 DIAGNOSIS — Z510 Encounter for antineoplastic radiation therapy: Secondary | ICD-10-CM | POA: Diagnosis not present

## 2017-01-09 DIAGNOSIS — C61 Malignant neoplasm of prostate: Secondary | ICD-10-CM | POA: Diagnosis not present

## 2017-01-10 ENCOUNTER — Ambulatory Visit
Admission: RE | Admit: 2017-01-10 | Discharge: 2017-01-10 | Disposition: A | Discharge status: Home / Self Care | Payer: Medicare Other | Source: Ambulatory Visit | Attending: Radiation Oncology | Admitting: Radiation Oncology

## 2017-01-10 DIAGNOSIS — Z510 Encounter for antineoplastic radiation therapy: Secondary | ICD-10-CM | POA: Diagnosis not present

## 2017-01-10 DIAGNOSIS — C61 Malignant neoplasm of prostate: Secondary | ICD-10-CM | POA: Diagnosis not present

## 2017-01-11 ENCOUNTER — Ambulatory Visit
Admission: RE | Admit: 2017-01-11 | Discharge: 2017-01-11 | Disposition: A | Discharge status: Home / Self Care | Payer: Medicare Other | Source: Ambulatory Visit | Attending: Radiation Oncology | Admitting: Radiation Oncology

## 2017-01-11 DIAGNOSIS — Z510 Encounter for antineoplastic radiation therapy: Secondary | ICD-10-CM | POA: Diagnosis not present

## 2017-01-11 DIAGNOSIS — C61 Malignant neoplasm of prostate: Secondary | ICD-10-CM | POA: Diagnosis not present

## 2017-01-12 ENCOUNTER — Ambulatory Visit
Admission: RE | Admit: 2017-01-12 | Discharge: 2017-01-12 | Disposition: A | Discharge status: Home / Self Care | Payer: Medicare Other | Source: Ambulatory Visit | Attending: Radiation Oncology | Admitting: Radiation Oncology

## 2017-01-12 DIAGNOSIS — Z510 Encounter for antineoplastic radiation therapy: Secondary | ICD-10-CM | POA: Diagnosis not present

## 2017-01-12 DIAGNOSIS — C61 Malignant neoplasm of prostate: Secondary | ICD-10-CM | POA: Diagnosis not present

## 2017-01-13 ENCOUNTER — Ambulatory Visit
Admission: RE | Admit: 2017-01-13 | Discharge: 2017-01-13 | Disposition: A | Discharge status: Home / Self Care | Payer: Medicare Other | Source: Ambulatory Visit | Attending: Radiation Oncology | Admitting: Radiation Oncology

## 2017-01-13 DIAGNOSIS — Z510 Encounter for antineoplastic radiation therapy: Secondary | ICD-10-CM | POA: Diagnosis not present

## 2017-01-13 DIAGNOSIS — C61 Malignant neoplasm of prostate: Secondary | ICD-10-CM | POA: Diagnosis not present

## 2017-01-16 ENCOUNTER — Encounter: Payer: Self-pay | Admitting: General Practice

## 2017-01-16 ENCOUNTER — Ambulatory Visit
Admission: RE | Admit: 2017-01-16 | Discharge: 2017-01-16 | Disposition: A | Discharge status: Home / Self Care | Payer: Medicare Other | Source: Ambulatory Visit | Attending: Radiation Oncology | Admitting: Radiation Oncology

## 2017-01-16 DIAGNOSIS — Z510 Encounter for antineoplastic radiation therapy: Secondary | ICD-10-CM | POA: Diagnosis not present

## 2017-01-16 DIAGNOSIS — C61 Malignant neoplasm of prostate: Secondary | ICD-10-CM | POA: Diagnosis not present

## 2017-01-16 NOTE — Progress Notes (Signed)
[place] Spiritual Care Note  Visited briefly with [name] today following xrt.  He was in good spirits, verbalizing deep gratitude for the consistently kind and supportive care from staff at [facility].  We discussed the value of human connection as a core of meaning-making and spiritual nurture, especially through vulnerability and hard times.  He also reports that his wife is doing better after a series of challenges.  Mr Lockridge knows to contact Support Team anytime further connection is desired and may contact chaplain to schedule a visit on his own or with Ms Zinda as they continue to move through his treatment process.   [place], [place], [facility] Pager [phone] Voicemail [phone]

## 2017-01-17 ENCOUNTER — Ambulatory Visit
Admission: RE | Admit: 2017-01-17 | Discharge: 2017-01-17 | Disposition: A | Discharge status: Home / Self Care | Payer: Medicare Other | Source: Ambulatory Visit | Attending: Radiation Oncology | Admitting: Radiation Oncology

## 2017-01-17 DIAGNOSIS — Z510 Encounter for antineoplastic radiation therapy: Secondary | ICD-10-CM | POA: Diagnosis not present

## 2017-01-17 DIAGNOSIS — C61 Malignant neoplasm of prostate: Secondary | ICD-10-CM | POA: Diagnosis not present

## 2017-01-18 ENCOUNTER — Ambulatory Visit
Admission: RE | Admit: 2017-01-18 | Discharge: 2017-01-18 | Disposition: A | Discharge status: Home / Self Care | Payer: Medicare Other | Source: Ambulatory Visit | Attending: Radiation Oncology | Admitting: Radiation Oncology

## 2017-01-18 DIAGNOSIS — C61 Malignant neoplasm of prostate: Secondary | ICD-10-CM | POA: Diagnosis not present

## 2017-01-18 DIAGNOSIS — Z510 Encounter for antineoplastic radiation therapy: Secondary | ICD-10-CM | POA: Diagnosis not present

## 2017-01-19 ENCOUNTER — Ambulatory Visit
Admission: RE | Admit: 2017-01-19 | Discharge: 2017-01-19 | Disposition: A | Discharge status: Home / Self Care | Payer: Medicare Other | Source: Ambulatory Visit | Attending: Radiation Oncology | Admitting: Radiation Oncology

## 2017-01-19 DIAGNOSIS — Z510 Encounter for antineoplastic radiation therapy: Secondary | ICD-10-CM | POA: Diagnosis not present

## 2017-01-19 DIAGNOSIS — C61 Malignant neoplasm of prostate: Secondary | ICD-10-CM | POA: Diagnosis not present

## 2017-01-20 ENCOUNTER — Ambulatory Visit
Admission: RE | Admit: 2017-01-20 | Discharge: 2017-01-20 | Disposition: A | Discharge status: Home / Self Care | Payer: Medicare Other | Source: Ambulatory Visit | Attending: Radiation Oncology | Admitting: Radiation Oncology

## 2017-01-20 DIAGNOSIS — C61 Malignant neoplasm of prostate: Secondary | ICD-10-CM | POA: Diagnosis not present

## 2017-01-20 DIAGNOSIS — Z510 Encounter for antineoplastic radiation therapy: Secondary | ICD-10-CM | POA: Diagnosis not present

## 2017-01-23 ENCOUNTER — Ambulatory Visit
Admission: RE | Admit: 2017-01-23 | Discharge: 2017-01-23 | Disposition: A | Discharge status: Home / Self Care | Payer: Medicare Other | Source: Ambulatory Visit | Attending: Radiation Oncology | Admitting: Radiation Oncology

## 2017-01-23 DIAGNOSIS — C61 Malignant neoplasm of prostate: Secondary | ICD-10-CM | POA: Diagnosis not present

## 2017-01-23 DIAGNOSIS — Z510 Encounter for antineoplastic radiation therapy: Secondary | ICD-10-CM | POA: Diagnosis not present

## 2017-01-24 ENCOUNTER — Ambulatory Visit
Admission: RE | Admit: 2017-01-24 | Discharge: 2017-01-24 | Disposition: A | Discharge status: Home / Self Care | Payer: Medicare Other | Source: Ambulatory Visit | Attending: Radiation Oncology | Admitting: Radiation Oncology

## 2017-01-24 DIAGNOSIS — C61 Malignant neoplasm of prostate: Secondary | ICD-10-CM | POA: Diagnosis not present

## 2017-01-24 DIAGNOSIS — Z510 Encounter for antineoplastic radiation therapy: Secondary | ICD-10-CM | POA: Diagnosis not present

## 2017-01-25 ENCOUNTER — Ambulatory Visit
Admission: RE | Admit: 2017-01-25 | Discharge: 2017-01-25 | Disposition: A | Discharge status: Home / Self Care | Payer: Medicare Other | Source: Ambulatory Visit | Attending: Radiation Oncology | Admitting: Radiation Oncology

## 2017-01-25 DIAGNOSIS — Z510 Encounter for antineoplastic radiation therapy: Secondary | ICD-10-CM | POA: Diagnosis not present

## 2017-01-25 DIAGNOSIS — C61 Malignant neoplasm of prostate: Secondary | ICD-10-CM | POA: Diagnosis not present

## 2017-01-26 ENCOUNTER — Ambulatory Visit: Payer: Medicare Other

## 2017-01-27 ENCOUNTER — Ambulatory Visit
Admission: RE | Admit: 2017-01-27 | Discharge: 2017-01-27 | Disposition: A | Discharge status: Home / Self Care | Payer: Medicare Other | Source: Ambulatory Visit | Attending: Radiation Oncology | Admitting: Radiation Oncology

## 2017-01-27 DIAGNOSIS — C61 Malignant neoplasm of prostate: Secondary | ICD-10-CM | POA: Diagnosis not present

## 2017-01-27 DIAGNOSIS — Z510 Encounter for antineoplastic radiation therapy: Secondary | ICD-10-CM | POA: Diagnosis not present

## 2017-01-29 ENCOUNTER — Ambulatory Visit
Admission: RE | Admit: 2017-01-29 | Discharge: 2017-01-29 | Disposition: A | Discharge status: Home / Self Care | Payer: Medicare Other | Source: Ambulatory Visit | Attending: Radiation Oncology | Admitting: Radiation Oncology

## 2017-01-29 DIAGNOSIS — Z510 Encounter for antineoplastic radiation therapy: Secondary | ICD-10-CM | POA: Diagnosis not present

## 2017-01-29 DIAGNOSIS — C61 Malignant neoplasm of prostate: Secondary | ICD-10-CM | POA: Diagnosis not present

## 2017-01-30 ENCOUNTER — Ambulatory Visit
Admission: RE | Admit: 2017-01-30 | Discharge: 2017-01-30 | Disposition: A | Discharge status: Home / Self Care | Payer: Medicare Other | Source: Ambulatory Visit | Attending: Radiation Oncology | Admitting: Radiation Oncology

## 2017-01-30 ENCOUNTER — Encounter: Payer: Self-pay | Admitting: Medical Oncology

## 2017-01-30 DIAGNOSIS — Z510 Encounter for antineoplastic radiation therapy: Secondary | ICD-10-CM | POA: Diagnosis not present

## 2017-01-30 DIAGNOSIS — C61 Malignant neoplasm of prostate: Secondary | ICD-10-CM | POA: Diagnosis not present

## 2017-01-31 ENCOUNTER — Ambulatory Visit: Payer: Medicare Other

## 2017-01-31 ENCOUNTER — Ambulatory Visit
Admission: RE | Admit: 2017-01-31 | Discharge: 2017-01-31 | Disposition: A | Discharge status: Home / Self Care | Payer: Medicare Other | Source: Ambulatory Visit | Attending: Radiation Oncology | Admitting: Radiation Oncology

## 2017-01-31 DIAGNOSIS — C61 Malignant neoplasm of prostate: Secondary | ICD-10-CM | POA: Diagnosis not present

## 2017-01-31 DIAGNOSIS — Z510 Encounter for antineoplastic radiation therapy: Secondary | ICD-10-CM | POA: Diagnosis not present

## 2017-02-01 ENCOUNTER — Ambulatory Visit: Payer: Medicare Other

## 2017-02-01 ENCOUNTER — Ambulatory Visit
Admission: RE | Admit: 2017-02-01 | Discharge: 2017-02-01 | Disposition: A | Discharge status: Home / Self Care | Payer: Medicare Other | Source: Ambulatory Visit | Attending: Radiation Oncology | Admitting: Radiation Oncology

## 2017-02-01 DIAGNOSIS — Z510 Encounter for antineoplastic radiation therapy: Secondary | ICD-10-CM | POA: Diagnosis not present

## 2017-02-01 DIAGNOSIS — C61 Malignant neoplasm of prostate: Secondary | ICD-10-CM | POA: Diagnosis not present

## 2017-02-06 ENCOUNTER — Ambulatory Visit
Admission: RE | Admit: 2017-02-06 | Discharge: 2017-02-06 | Disposition: A | Discharge status: Home / Self Care | Payer: Medicare Other | Source: Ambulatory Visit | Attending: Radiation Oncology | Admitting: Radiation Oncology

## 2017-02-06 DIAGNOSIS — C61 Malignant neoplasm of prostate: Secondary | ICD-10-CM | POA: Diagnosis not present

## 2017-02-06 DIAGNOSIS — Z510 Encounter for antineoplastic radiation therapy: Secondary | ICD-10-CM | POA: Diagnosis not present

## 2017-02-07 ENCOUNTER — Ambulatory Visit
Admission: RE | Admit: 2017-02-07 | Discharge: 2017-02-07 | Disposition: A | Discharge status: Home / Self Care | Payer: Medicare Other | Source: Ambulatory Visit | Attending: Radiation Oncology | Admitting: Radiation Oncology

## 2017-02-07 DIAGNOSIS — Z510 Encounter for antineoplastic radiation therapy: Secondary | ICD-10-CM | POA: Diagnosis not present

## 2017-02-07 DIAGNOSIS — C61 Malignant neoplasm of prostate: Secondary | ICD-10-CM | POA: Diagnosis not present

## 2017-02-08 ENCOUNTER — Ambulatory Visit
Admission: RE | Admit: 2017-02-08 | Discharge: 2017-02-08 | Disposition: A | Discharge status: Home / Self Care | Payer: Medicare Other | Source: Ambulatory Visit | Attending: Radiation Oncology | Admitting: Radiation Oncology

## 2017-02-08 DIAGNOSIS — C61 Malignant neoplasm of prostate: Secondary | ICD-10-CM | POA: Diagnosis not present

## 2017-02-08 DIAGNOSIS — Z510 Encounter for antineoplastic radiation therapy: Secondary | ICD-10-CM | POA: Diagnosis not present

## 2017-02-09 ENCOUNTER — Ambulatory Visit
Admission: RE | Admit: 2017-02-09 | Discharge: 2017-02-09 | Disposition: A | Discharge status: Home / Self Care | Payer: Medicare Other | Source: Ambulatory Visit | Attending: Radiation Oncology | Admitting: Radiation Oncology

## 2017-02-09 DIAGNOSIS — C61 Malignant neoplasm of prostate: Secondary | ICD-10-CM | POA: Diagnosis not present

## 2017-02-09 DIAGNOSIS — Z510 Encounter for antineoplastic radiation therapy: Secondary | ICD-10-CM | POA: Diagnosis not present

## 2017-02-10 ENCOUNTER — Ambulatory Visit
Admission: RE | Admit: 2017-02-10 | Discharge: 2017-02-10 | Disposition: A | Discharge status: Home / Self Care | Payer: Medicare Other | Source: Ambulatory Visit | Attending: Radiation Oncology | Admitting: Radiation Oncology

## 2017-02-10 DIAGNOSIS — Z510 Encounter for antineoplastic radiation therapy: Secondary | ICD-10-CM | POA: Diagnosis not present

## 2017-02-10 DIAGNOSIS — C61 Malignant neoplasm of prostate: Secondary | ICD-10-CM | POA: Diagnosis not present

## 2017-02-13 ENCOUNTER — Ambulatory Visit
Admission: RE | Admit: 2017-02-13 | Discharge: 2017-02-13 | Disposition: A | Discharge status: Home / Self Care | Payer: Medicare Other | Source: Ambulatory Visit | Attending: Radiation Oncology | Admitting: Radiation Oncology

## 2017-02-13 DIAGNOSIS — C61 Malignant neoplasm of prostate: Secondary | ICD-10-CM | POA: Diagnosis not present

## 2017-02-13 DIAGNOSIS — Z510 Encounter for antineoplastic radiation therapy: Secondary | ICD-10-CM | POA: Diagnosis not present

## 2017-02-14 ENCOUNTER — Ambulatory Visit
Admission: RE | Admit: 2017-02-14 | Discharge: 2017-02-14 | Disposition: A | Discharge status: Home / Self Care | Payer: Medicare Other | Source: Ambulatory Visit | Attending: Radiation Oncology | Admitting: Radiation Oncology

## 2017-02-14 DIAGNOSIS — C61 Malignant neoplasm of prostate: Secondary | ICD-10-CM | POA: Diagnosis not present

## 2017-02-14 DIAGNOSIS — Z510 Encounter for antineoplastic radiation therapy: Secondary | ICD-10-CM | POA: Diagnosis not present

## 2017-02-15 ENCOUNTER — Ambulatory Visit
Admission: RE | Admit: 2017-02-15 | Discharge: 2017-02-15 | Disposition: A | Discharge status: Home / Self Care | Payer: Medicare Other | Source: Ambulatory Visit | Attending: Radiation Oncology | Admitting: Radiation Oncology

## 2017-02-15 DIAGNOSIS — C61 Malignant neoplasm of prostate: Secondary | ICD-10-CM | POA: Diagnosis not present

## 2017-02-15 DIAGNOSIS — Z510 Encounter for antineoplastic radiation therapy: Secondary | ICD-10-CM | POA: Diagnosis not present

## 2017-02-16 ENCOUNTER — Ambulatory Visit
Admission: RE | Admit: 2017-02-16 | Discharge: 2017-02-16 | Disposition: A | Discharge status: Home / Self Care | Payer: Medicare Other | Source: Ambulatory Visit | Attending: Radiation Oncology | Admitting: Radiation Oncology

## 2017-02-16 DIAGNOSIS — Z510 Encounter for antineoplastic radiation therapy: Secondary | ICD-10-CM | POA: Diagnosis not present

## 2017-02-16 DIAGNOSIS — C61 Malignant neoplasm of prostate: Secondary | ICD-10-CM | POA: Diagnosis not present

## 2017-02-17 ENCOUNTER — Ambulatory Visit
Admission: RE | Admit: 2017-02-17 | Discharge: 2017-02-17 | Disposition: A | Discharge status: Home / Self Care | Payer: Medicare Other | Source: Ambulatory Visit | Attending: Radiation Oncology | Admitting: Radiation Oncology

## 2017-02-17 DIAGNOSIS — C61 Malignant neoplasm of prostate: Secondary | ICD-10-CM | POA: Diagnosis not present

## 2017-02-17 DIAGNOSIS — Z510 Encounter for antineoplastic radiation therapy: Secondary | ICD-10-CM | POA: Diagnosis not present

## 2017-02-20 ENCOUNTER — Ambulatory Visit: Payer: Medicare Other

## 2017-02-21 ENCOUNTER — Ambulatory Visit
Admission: RE | Admit: 2017-02-21 | Discharge: 2017-02-21 | Disposition: A | Discharge status: Home / Self Care | Payer: Medicare Other | Source: Ambulatory Visit | Attending: Radiation Oncology | Admitting: Radiation Oncology

## 2017-02-21 DIAGNOSIS — Z510 Encounter for antineoplastic radiation therapy: Secondary | ICD-10-CM | POA: Diagnosis not present

## 2017-02-21 DIAGNOSIS — C61 Malignant neoplasm of prostate: Secondary | ICD-10-CM | POA: Diagnosis not present

## 2017-02-22 ENCOUNTER — Ambulatory Visit: Payer: Medicare Other

## 2017-02-22 ENCOUNTER — Ambulatory Visit
Admission: RE | Admit: 2017-02-22 | Discharge: 2017-02-22 | Disposition: A | Discharge status: Home / Self Care | Payer: Medicare Other | Source: Ambulatory Visit | Attending: Radiation Oncology | Admitting: Radiation Oncology

## 2017-02-22 DIAGNOSIS — Z510 Encounter for antineoplastic radiation therapy: Secondary | ICD-10-CM | POA: Diagnosis not present

## 2017-02-22 DIAGNOSIS — C61 Malignant neoplasm of prostate: Secondary | ICD-10-CM | POA: Diagnosis not present

## 2017-02-23 ENCOUNTER — Ambulatory Visit: Payer: Medicare Other

## 2017-02-23 ENCOUNTER — Ambulatory Visit
Admission: RE | Admit: 2017-02-23 | Discharge: 2017-02-23 | Disposition: A | Discharge status: Home / Self Care | Payer: Medicare Other | Source: Ambulatory Visit | Attending: Radiation Oncology | Admitting: Radiation Oncology

## 2017-02-23 DIAGNOSIS — C61 Malignant neoplasm of prostate: Secondary | ICD-10-CM | POA: Diagnosis not present

## 2017-02-23 DIAGNOSIS — Z510 Encounter for antineoplastic radiation therapy: Secondary | ICD-10-CM | POA: Diagnosis not present

## 2017-02-24 ENCOUNTER — Ambulatory Visit
Admission: RE | Admit: 2017-02-24 | Discharge: 2017-02-24 | Disposition: A | Discharge status: Home / Self Care | Payer: Medicare Other | Source: Ambulatory Visit | Attending: Radiation Oncology | Admitting: Radiation Oncology

## 2017-02-24 ENCOUNTER — Encounter: Payer: Self-pay | Admitting: Medical Oncology

## 2017-02-24 ENCOUNTER — Encounter: Payer: Self-pay | Admitting: Radiation Oncology

## 2017-02-24 DIAGNOSIS — C61 Malignant neoplasm of prostate: Secondary | ICD-10-CM | POA: Diagnosis not present

## 2017-02-24 DIAGNOSIS — Z510 Encounter for antineoplastic radiation therapy: Secondary | ICD-10-CM | POA: Diagnosis not present

## 2017-02-27 NOTE — Progress Notes (Signed)
  Radiation Oncology         [phone]) [phone] ________________________________  Name: Darrell Hernandez. MRN: [id number]  Date: 02/24/2017  DOB: September 27, 1935  End of Treatment Note  Diagnosis:   81 y.o. male with stage T2aadenocarcinoma of the prostate with a Gleason's score of 4+5and a PSA of 6.1    Indication for treatment:  Curative, Definitive Radiotherapy       Radiation treatment dates:   12/28/2016 - 02/24/2017  Site/dose:  1. The prostate, seminal vesicles, and pelvic lymph nodes were initially treated to 45 Gy in 25 fractions of 1.8 Gy  2. The prostate only was boosted to 75 Gy with 15 additional fractions of 2.0 Gy   Beams/energy:  1. The prostate, seminal vesicles, and pelvic lymph nodes were initially treated using VMAT intensity modulated radiotherapy delivering 6 megavolt photons. Image guidance was performed with CB-CT studies prior to each fraction. He was immobilized with a body fix lower extremity mold.  2. The prostate only was boosted using VMAT intensity modulated radiotherapy delivering 6 megavolt photons. Image guidance was performed with CB-CT studies prior to each fraction. He was immobilized with a body fix lower extremity mold.  Narrative: The patient tolerated radiation treatment relatively well.   He experienced modest fatigue and some minor urinary irritation with symptoms of frequency, urgency with leakage, and nocturia x4-5. He continued on Flomax throughout the course of his treatment and denied any dysuria, hematuria, hesitancy, or incomplete emptying. He did experience some diarrhea which was managed with Imodium and low residue diet and had resolved prior to completing treatment.  Plan: The patient has completed radiation treatment. He will return to radiation oncology clinic for routine followup in one month. I advised him to call or return sooner if he has any questions or concerns related to his recovery or  treatment. ________________________________  Sheral Apley. Tammi Klippel, M.D.  This document serves as a record of services personally performed by Tyler Pita, MD. It was created on his behalf by Rae Lips, a trained medical scribe. The creation of this record is based on the scribe's personal observations and the provider's statements to them. This document has been checked and approved by the attending provider.

## 2017-03-28 DIAGNOSIS — I10 Essential (primary) hypertension: Secondary | ICD-10-CM | POA: Diagnosis not present

## 2017-03-28 DIAGNOSIS — Z5181 Encounter for therapeutic drug level monitoring: Secondary | ICD-10-CM | POA: Diagnosis not present

## 2017-03-28 DIAGNOSIS — Z79899 Other long term (current) drug therapy: Secondary | ICD-10-CM | POA: Diagnosis not present

## 2017-03-28 DIAGNOSIS — E7800 Pure hypercholesterolemia, unspecified: Secondary | ICD-10-CM | POA: Diagnosis not present

## 2017-03-29 ENCOUNTER — Other Ambulatory Visit: Payer: Self-pay

## 2017-03-29 ENCOUNTER — Ambulatory Visit
Admission: RE | Admit: 2017-03-29 | Discharge: 2017-03-29 | Disposition: A | Discharge status: Home / Self Care | Payer: Medicare Other | Source: Ambulatory Visit | Attending: Urology | Admitting: Urology

## 2017-03-29 ENCOUNTER — Encounter: Payer: Self-pay | Admitting: Urology

## 2017-03-29 VITALS — BP 150/79 | HR 54 | Temp 98.3°F | Resp 18 | Ht 67.5 in | Wt 143.0 lb

## 2017-03-29 DIAGNOSIS — R768 Other specified abnormal immunological findings in serum: Secondary | ICD-10-CM | POA: Diagnosis not present

## 2017-03-29 DIAGNOSIS — Z923 Personal history of irradiation: Secondary | ICD-10-CM | POA: Insufficient documentation

## 2017-03-29 DIAGNOSIS — C61 Malignant neoplasm of prostate: Secondary | ICD-10-CM

## 2017-03-29 DIAGNOSIS — I129 Hypertensive chronic kidney disease with stage 1 through stage 4 chronic kidney disease, or unspecified chronic kidney disease: Secondary | ICD-10-CM | POA: Diagnosis not present

## 2017-03-29 DIAGNOSIS — Z79899 Other long term (current) drug therapy: Secondary | ICD-10-CM | POA: Diagnosis not present

## 2017-03-29 DIAGNOSIS — E785 Hyperlipidemia, unspecified: Secondary | ICD-10-CM | POA: Diagnosis not present

## 2017-03-29 DIAGNOSIS — N183 Chronic kidney disease, stage 3 (moderate): Secondary | ICD-10-CM | POA: Diagnosis not present

## 2017-03-29 DIAGNOSIS — R972 Elevated prostate specific antigen [PSA]: Secondary | ICD-10-CM | POA: Diagnosis not present

## 2017-03-29 NOTE — Progress Notes (Signed)
Radiation Oncology         [phone]) [phone] ________________________________  Name: Darrell Hernandez. MRN: [id number]  Date: 03/29/2017  DOB: 10-Sep-1935  Post Treatment Note  CC: Darrell Gravel, MD  Darrell Bring, MD  Diagnosis:   82 y.o. male with stage T2aadenocarcinoma of the prostate with a Gleason's score of 4+5and a PSA of 6.1  Interval Since Last Radiation:  4.5 weeks  12/28/2016 - 02/24/2017: 1. The prostate, seminal vesicles, and pelvic lymph nodes were initially treated to 45 Gy in 25 fractions of 1.8 Gy  2. The prostate only was boosted to 75 Gy with 15 additional fractions of 2.0 Gy   Narrative:  The patient returns today for routine follow-up.  He tolerated radiation treatment relatively well.   He experienced modest fatigue and some minor urinary irritation with symptoms of frequency, urgency with leakage, and nocturia x4-5. He continued on Flomax throughout the course of his treatment and denied any dysuria, hematuria, hesitancy, or incomplete emptying. He did experience some diarrhea which was managed with Imodium and low residue diet and had resolved prior to completing treatment.                           On review of systems, the patient states that he is doing well overall.  He feels great and has not noted any decline in his energy despite being on ADT throughout his course of radiotherapy.  He continues with increased urinary frequency and urgency but denies dysuria, gross hematuria, weak stream, incomplete emptying or incontinence.  His current IPSS is 16.  He continues taking Flomax daily to help manage his LUTS and feels this is helpful. Nocturia is 3-4x/night.  He did experience some occasional diarrhea during treatment but this has resolved and bowel habits are back to normal at this point.  He reports a healthy appetite and is maintaining his weight.  ALLERGIES:  is allergic to other.  Meds: Current Outpatient Medications  Medication Sig Dispense Refill  .  amLODipine (NORVASC) 10 MG tablet Take 5 mg by mouth at bedtime.     . fosinopril (MONOPRIL) 40 MG tablet Take 60 mg by mouth every evening. TAKES ONE AND HALF TABLETS = 60MG     . hydrochlorothiazide (HYDRODIURIL) 25 MG tablet Take 25 mg by mouth daily.    Marland Kitchen LORazepam (ATIVAN) 1 MG tablet Take 0.5-1 mg by mouth every 6 (six) hours as needed.     . metoprolol tartrate (LOPRESSOR) 25 MG tablet Take 25 mg by mouth at bedtime.     . Multiple Vitamins-Minerals (ZINC PO) Take by mouth daily.    Marland Kitchen PARoxetine (PAXIL) 20 MG tablet Take 20 mg by mouth every evening.    . ranitidine (ZANTAC) 75 MG tablet Take 75 mg by mouth daily as needed for heartburn.    . tamsulosin (FLOMAX) 0.4 MG CAPS capsule Take 1 capsule (0.4 mg total) by mouth daily after supper. 30 capsule 5   No current facility-administered medications for this encounter.     Physical Findings:  height is 5' 7.5" (1.715 m) and weight is 143 lb (64.9 kg). His oral temperature is 98.3 F (36.8 C). His blood pressure is 150/79 (abnormal) and his pulse is 54 (abnormal). His respiration is 18 and oxygen saturation is 99%.  Pain Assessment Pain Score: 0-No pain/10 In general this is a well appearing caucasian male in no acute distress. He's alert and oriented x4 and appropriate throughout the  examination. Cardiopulmonary assessment is negative for acute distress and he exhibits normal effort.   Lab Findings: Lab Results  Component Value Date   HGB 11.9 (L) 12/09/2016   HCT 35.0 (L) 12/09/2016     Radiographic Findings: No results found.  Impression/Plan: 1. 82 y.o. male with stage T2aadenocarcinoma of the prostate with a Gleason's score of 4+5and a PSA of 6.1.   He will continue to follow up with urology for ongoing PSA determinations and anticipates a follow up appointment with Dr. Diona Hernandez in March or April. He anticipates completing a 2 year course of ADT which he is tolerating well.  He understands what to expect with regards to  PSA monitoring going forward. I will look forward to following his response to treatment via correspondence with urology, and would be happy to continue to participate in his care if clinically indicated. He will continue on daily Flomax for an additional 2 months and then try coming off this medication prior to his follow up visit with Dr. Diona Hernandez.  I talked to the patient about what to expect in the future, including his risk for erectile dysfunction and rectal bleeding. I encouraged him to call or return to the office if he has any questions regarding his previous radiation or possible radiation side effects. He was comfortable with this plan and will follow up as needed.    Darrell Johns, PA-C

## 2017-04-03 DIAGNOSIS — L821 Other seborrheic keratosis: Secondary | ICD-10-CM | POA: Diagnosis not present

## 2017-04-03 DIAGNOSIS — L814 Other melanin hyperpigmentation: Secondary | ICD-10-CM | POA: Diagnosis not present

## 2017-04-03 DIAGNOSIS — L570 Actinic keratosis: Secondary | ICD-10-CM | POA: Diagnosis not present

## 2017-04-03 DIAGNOSIS — Z23 Encounter for immunization: Secondary | ICD-10-CM | POA: Diagnosis not present

## 2017-04-03 DIAGNOSIS — E7800 Pure hypercholesterolemia, unspecified: Secondary | ICD-10-CM | POA: Diagnosis not present

## 2017-04-03 DIAGNOSIS — L853 Xerosis cutis: Secondary | ICD-10-CM | POA: Diagnosis not present

## 2017-04-03 DIAGNOSIS — Z0000 Encounter for general adult medical examination without abnormal findings: Secondary | ICD-10-CM | POA: Diagnosis not present

## 2017-04-03 DIAGNOSIS — I10 Essential (primary) hypertension: Secondary | ICD-10-CM | POA: Diagnosis not present

## 2017-04-03 DIAGNOSIS — Z79899 Other long term (current) drug therapy: Secondary | ICD-10-CM | POA: Diagnosis not present

## 2017-04-18 DIAGNOSIS — R768 Other specified abnormal immunological findings in serum: Secondary | ICD-10-CM | POA: Diagnosis not present

## 2017-05-02 ENCOUNTER — Telehealth: Payer: Self-pay | Admitting: Oncology

## 2017-05-02 ENCOUNTER — Encounter: Payer: Self-pay | Admitting: Oncology

## 2017-05-02 NOTE — Telephone Encounter (Signed)
Hematology appt has been scheduled for the pt to see Dr. Alen Blew on 3/13 at 11am. Pt aware to arrive 30 minutes early. Letter mailed.

## 2017-05-24 ENCOUNTER — Inpatient Hospital Stay: Payer: Medicare Other | Attending: Oncology | Admitting: Oncology

## 2017-05-24 ENCOUNTER — Telehealth: Payer: Self-pay | Admitting: Oncology

## 2017-05-24 VITALS — BP 153/83 | HR 62 | Temp 97.4°F | Resp 16 | Wt 140.3 lb

## 2017-05-24 DIAGNOSIS — C9000 Multiple myeloma not having achieved remission: Secondary | ICD-10-CM | POA: Diagnosis not present

## 2017-05-24 DIAGNOSIS — D472 Monoclonal gammopathy: Secondary | ICD-10-CM

## 2017-05-24 DIAGNOSIS — Z79899 Other long term (current) drug therapy: Secondary | ICD-10-CM | POA: Insufficient documentation

## 2017-05-24 DIAGNOSIS — E859 Amyloidosis, unspecified: Secondary | ICD-10-CM | POA: Diagnosis not present

## 2017-05-24 DIAGNOSIS — C61 Malignant neoplasm of prostate: Secondary | ICD-10-CM | POA: Insufficient documentation

## 2017-05-24 DIAGNOSIS — Z923 Personal history of irradiation: Secondary | ICD-10-CM | POA: Diagnosis not present

## 2017-05-24 NOTE — Progress Notes (Signed)
Hematology and Oncology Follow Up Visit  Darrell Hernandez [id number] [birth year] 82 y.o. 05/24/2017 11:15 AM [name], MDKim, Jeneen Rinks, MD   Principle Diagnosis: 82 year old gentleman with the following issues:  1. Prostate cancer diagnosed in May 2018. Gleason score 4+5 = 9 PSA 4.7. His high-volume disease stage T2a.   2. Monoclonal gammopathy of undetermined significance. He was found to have elevated serum light chains with elevated free kappa light chain of 52.7 and elevated Kappa to lambda ratio of 3.34.     Prior Therapy:   Status post radiation therapy between 12/28/2016 and 02/24/2017. He received total of 75 gray to the prostate, seminal vesicle and lymph nodes.  Current therapy:  Androgen deprivation therapy to complete 2 years under the care of Dr. Diona Fanti.  Interim History:  Darrell Hernandez presents today for a follow-up visit. Since the last visit, he completed radiation therapy without any delayed complications. He continues to receive androgen deprivation therapy with hot flashes and sweats fatigue but otherwise well-tolerated therapy.   He was evaluated by Dr. Lorrene Reid to follow-up for his chronic renal insufficiency. His serum creatinine at that time was 1.49 with creatinine clearance of 44 mL/m. He had a 24-hour urine collection which showed an elevated protein to creatinine ratio. He also elevated free light chain of 52.7 Kappa. His kappa to lambda ratio was elevated at 3.34. He denied any bone pain or pathological fractures. He denied any recurrent pulmonary infections.   He does not report any headaches, blurry vision, syncope or seizures. Does not report any fevers, chills or sweats.  Does not report any cough, wheezing or hemoptysis.  Does not report any chest pain, palpitation, orthopnea or leg edema.  Does not report any nausea, vomiting or abdominal pain.  Does not report any constipation or diarrhea.  Does not report any skeletal complaints.    Does not report  frequency, urgency or hematuria.  Does not report any skin rashes or lesions. Does not report any heat or cold intolerance.  Does not report any lymphadenopathy or petechiae.  Does not report any anxiety or depression.  Remaining review of systems is negative.    Medications: I have reviewed the patient's current medications.  Current Outpatient Medications  Medication Sig Dispense Refill  . amLODipine (NORVASC) 10 MG tablet Take 5 mg by mouth at bedtime.     . fosinopril (MONOPRIL) 40 MG tablet Take 60 mg by mouth every evening. TAKES ONE AND HALF TABLETS = 60MG    . hydrochlorothiazide (HYDRODIURIL) 25 MG tablet Take 25 mg by mouth daily.    Marland Kitchen LORazepam (ATIVAN) 1 MG tablet Take 0.5-1 mg by mouth every 6 (six) hours as needed.     . metoprolol tartrate (LOPRESSOR) 25 MG tablet Take 25 mg by mouth at bedtime.     . Multiple Vitamins-Minerals (ZINC PO) Take by mouth daily.    Marland Kitchen PARoxetine (PAXIL) 20 MG tablet Take 20 mg by mouth every evening.    . ranitidine (ZANTAC) 75 MG tablet Take 75 mg by mouth daily as needed for heartburn.    . tamsulosin (FLOMAX) 0.4 MG CAPS capsule Take 1 capsule (0.4 mg total) by mouth daily after supper. 30 capsule 5   No current facility-administered medications for this visit.      Allergies:  Allergies  Allergen Reactions  . Other Shortness Of Breath and Swelling    ALL NUTS (THE MOST SEVERE IS PEANUTS)    Past Medical History, Surgical history, Social history, and Family  History were reviewed and updated.    Physical Exam: Blood pressure (!) 153/83, pulse 62, temperature (!) 97.4 F (36.3 C), temperature source Oral, resp. rate 16, weight 140 lb 4.8 oz (63.6 kg), SpO2 100 %. ECOG:  General appearance: alert and cooperative appeared without distress. Head: Normocephalic, without obvious abnormality Oropharynx: No oral thrush or ulcers. Eyes: No scleral icterus.  Pupils are equal and round reactive to light. Lymph nodes: Cervical, supraclavicular,  and axillary nodes normal. Heart:regular rate and rhythm, S1, S2 normal, no murmur, click, rub or gallop Lung:chest clear, no wheezing, rales, normal symmetric air entry Abdomin: soft, non-tender, without masses or organomegaly. Neurological: No motor, sensory deficits.  Intact deep tendon reflexes. Skin: No rashes or lesions.  No ecchymosis or petechiae. Musculoskeletal: No joint deformity or effusion. Psychiatric: Mood and affect are appropriate.    Lab Results: Lab Results  Component Value Date   HGB 11.9 (L) 12/09/2016   HCT 35.0 (L) 12/09/2016     Chemistry      Component Value Date/Time   NA 141 12/09/2016 1111   K 5.9 (H) 12/09/2016 1111   CL 105 12/09/2016 1111   BUN 34 (H) 12/09/2016 1111   CREATININE 1.50 (H) 12/09/2016 1111   No results found for: CALCIUM, ALKPHOS, AST, ALT, BILITOT     Impression and Plan:   82 year old gentleman with the following issues:  1. Monoclonal gammopathy detected with elevated serum free light chains including increasing kappa to lambda ratio of 3.34 with the upper limit of normal is 1.65.  The differential diagnosis was reviewed today with the patient which include monoclonal gammopathy of undetermined significance, multiple myeloma, amyloidosis or reactive findings. He has no clear-cut evidence of systemic disease or evidence of end organ damage. He insufficiency has been chronic and has not changed. Multiple myeloma felt less likely without any evidence of bone damage that is noted on multiple imaging studies including MRI, CT scan.  Current management standpoint, I recommended observation and surveillance and repeat myeloma workup in 6 months. Bone marrow biopsy may be needed if his serum light chains continue to rise.  2. Prostate cancer: He is a Gleason score 4+5 = 9 and a PSA of 4.7. He is status post definitive treatment with radiation and currently on androgen deprivation therapy. He does have high-risk disease and will require  monitoring in the future especially following his PSA. He continues to follow with Dr. Diona Fanti at this time.   3. Follow-up: Will be in 6 months after repeating protein studies.  15  minutes was spent with the patient face-to-face today.  More than 50% of time was dedicated to patient counseling, education and coordination of his multifaceted care.   Zola Button, MD 3/13/201911:15 AM

## 2017-05-24 NOTE — Telephone Encounter (Signed)
Scheduled appt per 3/13 los - Gave patient AVS and calender per los.  

## 2017-06-26 DIAGNOSIS — N3281 Overactive bladder: Secondary | ICD-10-CM | POA: Diagnosis not present

## 2017-06-26 DIAGNOSIS — C61 Malignant neoplasm of prostate: Secondary | ICD-10-CM | POA: Diagnosis not present

## 2017-07-03 DIAGNOSIS — L821 Other seborrheic keratosis: Secondary | ICD-10-CM | POA: Diagnosis not present

## 2017-07-03 DIAGNOSIS — L812 Freckles: Secondary | ICD-10-CM | POA: Diagnosis not present

## 2017-07-03 DIAGNOSIS — L570 Actinic keratosis: Secondary | ICD-10-CM | POA: Diagnosis not present

## 2017-07-03 DIAGNOSIS — C44219 Basal cell carcinoma of skin of left ear and external auricular canal: Secondary | ICD-10-CM | POA: Diagnosis not present

## 2017-07-03 DIAGNOSIS — C4441 Basal cell carcinoma of skin of scalp and neck: Secondary | ICD-10-CM | POA: Diagnosis not present

## 2017-07-03 DIAGNOSIS — D0471 Carcinoma in situ of skin of right lower limb, including hip: Secondary | ICD-10-CM | POA: Diagnosis not present

## 2017-07-03 DIAGNOSIS — L853 Xerosis cutis: Secondary | ICD-10-CM | POA: Diagnosis not present

## 2017-07-03 DIAGNOSIS — D485 Neoplasm of uncertain behavior of skin: Secondary | ICD-10-CM | POA: Diagnosis not present

## 2017-07-10 DIAGNOSIS — D0471 Carcinoma in situ of skin of right lower limb, including hip: Secondary | ICD-10-CM | POA: Diagnosis not present

## 2017-07-26 DIAGNOSIS — Z961 Presence of intraocular lens: Secondary | ICD-10-CM | POA: Diagnosis not present

## 2017-07-26 DIAGNOSIS — H2512 Age-related nuclear cataract, left eye: Secondary | ICD-10-CM | POA: Diagnosis not present

## 2017-08-08 DIAGNOSIS — S61211A Laceration without foreign body of left index finger without damage to nail, initial encounter: Secondary | ICD-10-CM | POA: Diagnosis not present

## 2017-08-10 DIAGNOSIS — Z85828 Personal history of other malignant neoplasm of skin: Secondary | ICD-10-CM | POA: Diagnosis not present

## 2017-08-10 DIAGNOSIS — C44219 Basal cell carcinoma of skin of left ear and external auricular canal: Secondary | ICD-10-CM | POA: Diagnosis not present

## 2017-08-11 DIAGNOSIS — S61459A Open bite of unspecified hand, initial encounter: Secondary | ICD-10-CM | POA: Diagnosis not present

## 2017-08-18 DIAGNOSIS — S61211S Laceration without foreign body of left index finger without damage to nail, sequela: Secondary | ICD-10-CM | POA: Diagnosis not present

## 2017-08-26 IMAGING — MR MR SHOULDER*L* W/O CM
4 of 5 series · 29 of 40 positions shown · non-contrast
Comparison: Plain films left shoulder 08/04/2014.

CLINICAL DATA: Posterior left shoulder pain since March 2016
with limited range of motion. No known injury.

EXAM:
MRI OF THE LEFT SHOULDER WITHOUT CONTRAST
TECHNIQUE: Multiplanar, multisequence MR imaging of the shoulder was performed.
No intravenous contrast was administered.

[Series 3: T2 fat-sat · axial · 4.0mm · 0.25mm/px · z∈[-11,+75]mm · 8 of 20 slices shown (1 of 3)]
[im 1/20]
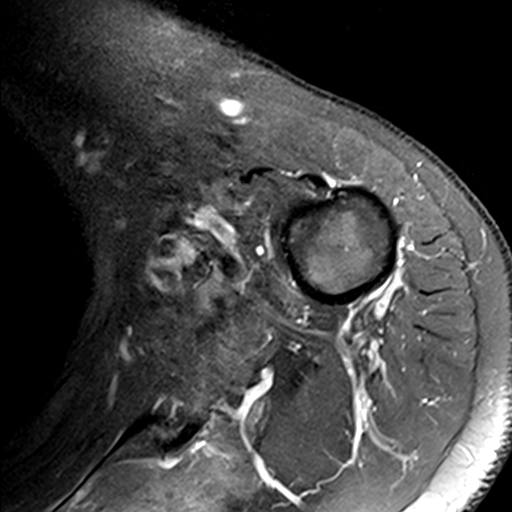
[im 3/20]
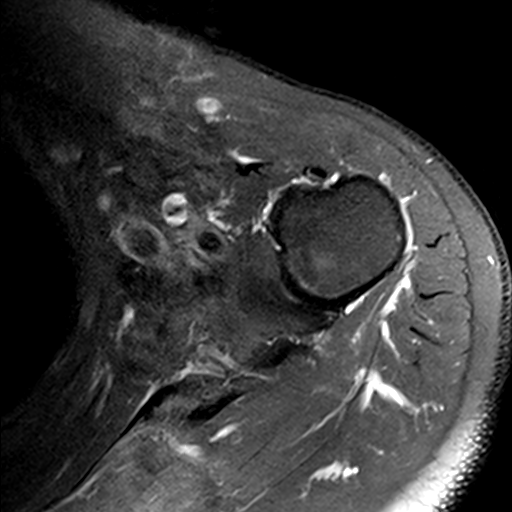
[im 7/20]
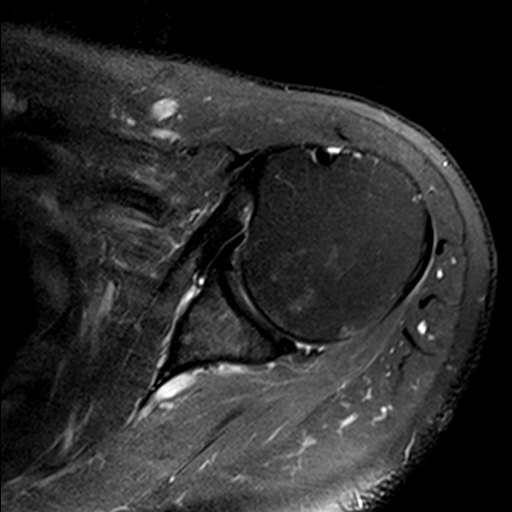
[im 9/20]
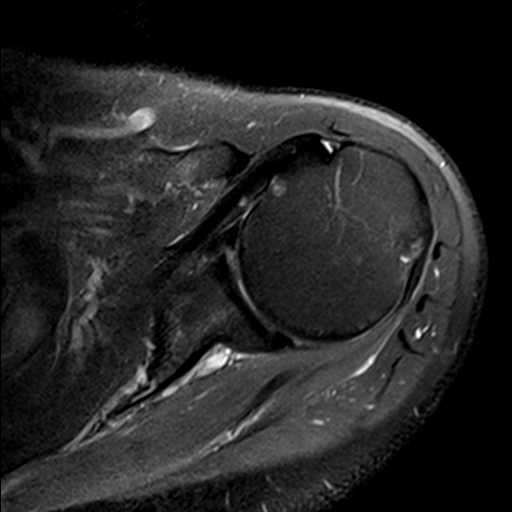
[im 11/20]
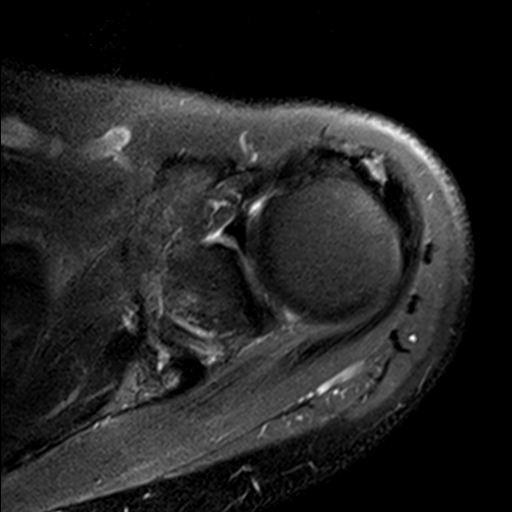
[im 13/20]
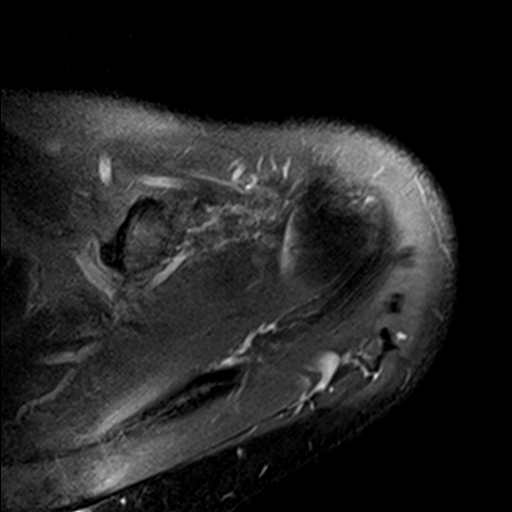
[im 17/20]
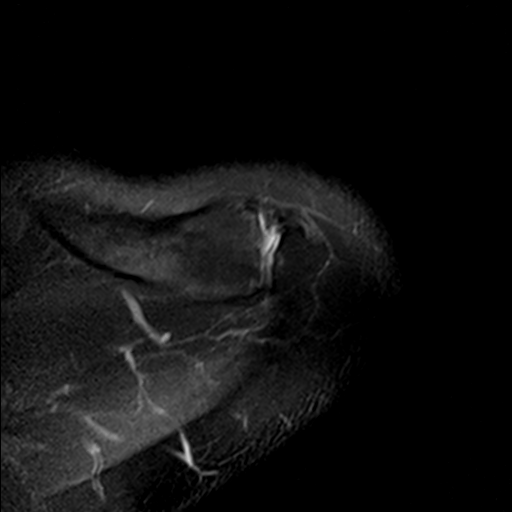
[im 20/20]
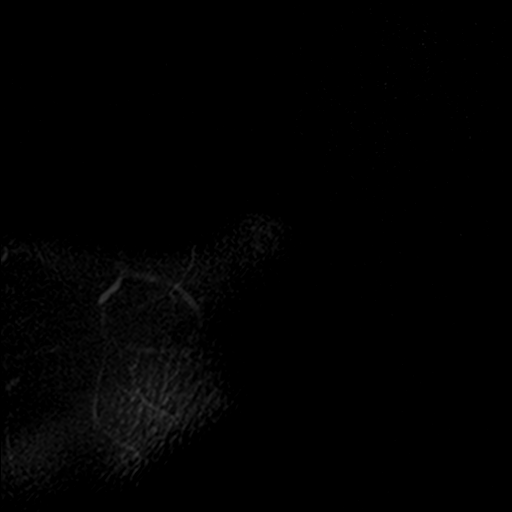

[Series 4: T2 fat-sat · oblique · 4.0mm · 0.55mm/px · 8 of 17 slices shown (2 of 3)]
[im 1/17]
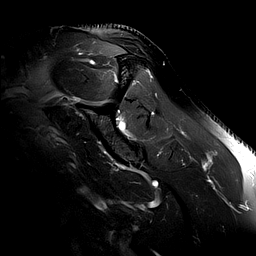
[im 3/17]
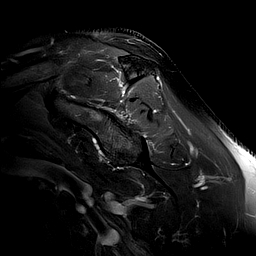
[im 5/17]
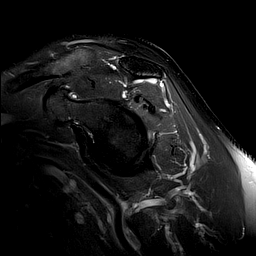
[im 7/17]
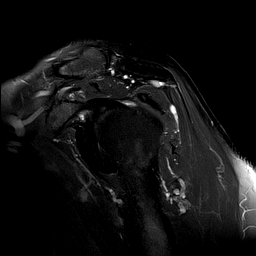
[im 10/17]
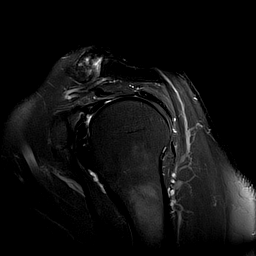
[im 12/17]
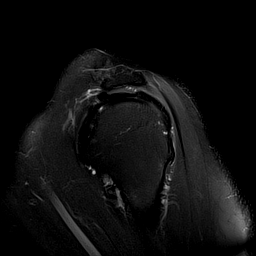
[im 14/17]
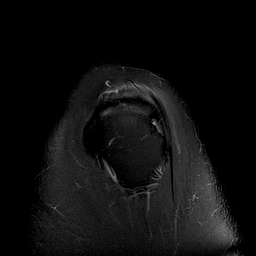
[im 17/17]
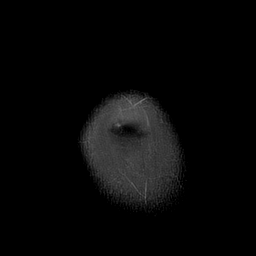

[Series 6: T2 fat-sat · oblique · 4.0mm · 0.55mm/px · 6 of 15 slices shown (3 of 3)]
[im 1/15]
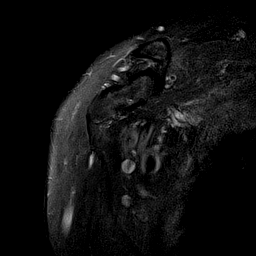
[im 3/15]
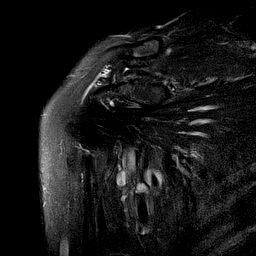
[im 5/15]
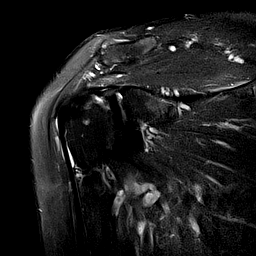
[im 8/15]
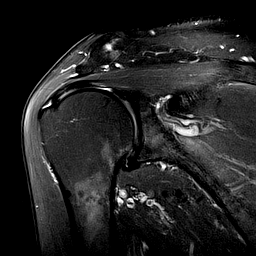
[im 10/15]
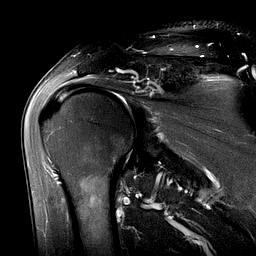
[im 12/15]
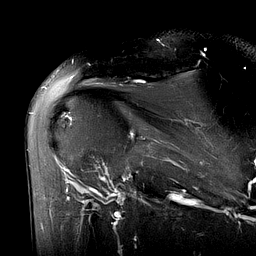

[Series 7: PD · oblique · 4.0mm · 0.27mm/px · 7 of 15 slices shown]
[im 1/15]
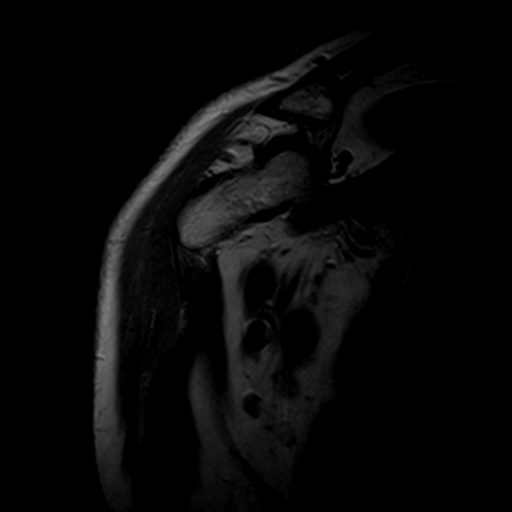
[im 3/15]
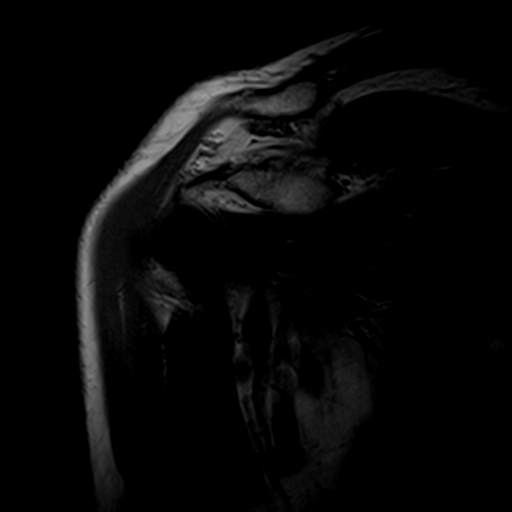
[im 5/15]
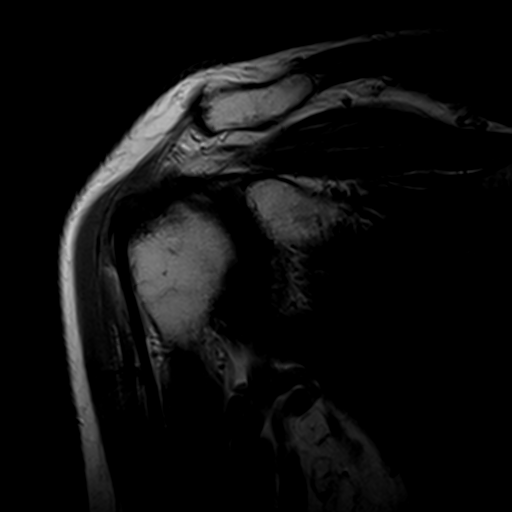
[im 8/15]
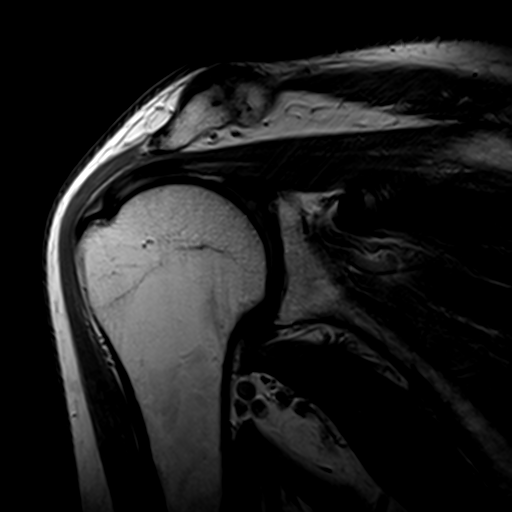
[im 10/15]
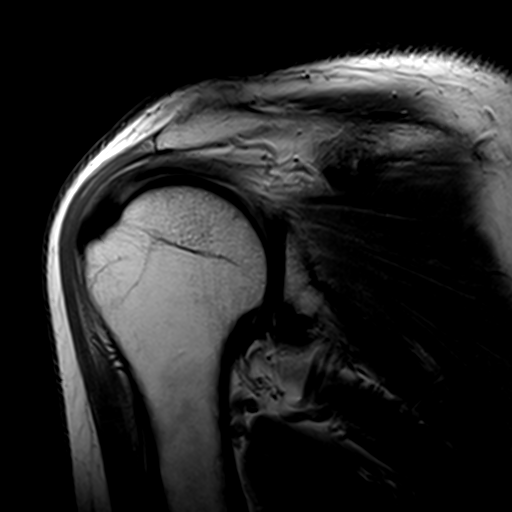
[im 12/15]
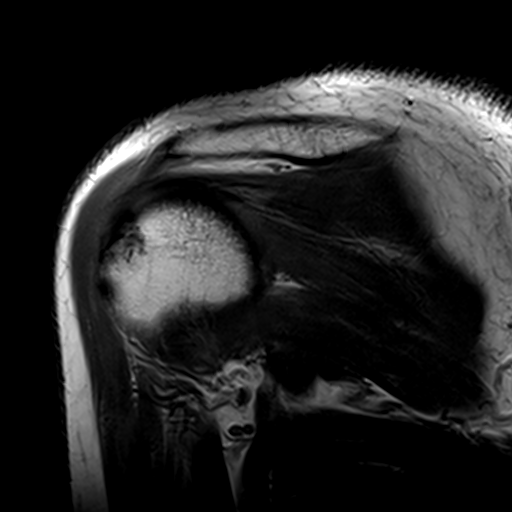
[im 15/15]
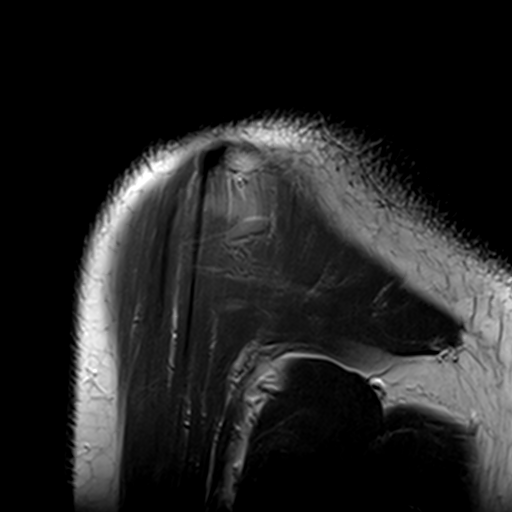

[29 of 40 positions shown; findings below may reference images not displayed]

FINDINGS: Rotator cuff: Supraspinatus and infraspinatus tendinopathy is
identified. Two small interstitial tears of the posterior
supraspinatus are seen but no full-thickness tear or retracted
tendon is identified.

Muscles:  Normal without atrophy or focal lesion.

Biceps long head:  Intact and normal in appearance.

Acromioclavicular Joint: Moderate degenerative change is seen. Type
1 acromion. No subacromial/subdeltoid bursal fluid. Small appearing
subacromial spur is identified.

Glenohumeral Joint: Negative.

Labrum:  Intact.

Bones:  No fracture or worrisome lesion.

Other: None.
IMPRESSION: Supraspinatus and infraspinatus tendinopathy. Two small interstitial
tears of the supraspinatus are identified but there is no
full-thickness tear or retracted tendon.

Moderate acromioclavicular osteoarthritis. Small subacromial spur
also noted.

## 2017-10-02 DIAGNOSIS — Z79899 Other long term (current) drug therapy: Secondary | ICD-10-CM | POA: Diagnosis not present

## 2017-10-02 DIAGNOSIS — Z5181 Encounter for therapeutic drug level monitoring: Secondary | ICD-10-CM | POA: Diagnosis not present

## 2017-10-02 DIAGNOSIS — I10 Essential (primary) hypertension: Secondary | ICD-10-CM | POA: Diagnosis not present

## 2017-10-05 ENCOUNTER — Telehealth: Payer: Self-pay | Admitting: Radiation Oncology

## 2017-10-05 NOTE — Telephone Encounter (Signed)
Received prescription refill request from [facility] for tamsulosin. Denied and replied "patient no longer under our care please request refill from Dr. Diona Fanti. Fax confirmation of delivery obtained.

## 2017-10-09 DIAGNOSIS — Z0000 Encounter for general adult medical examination without abnormal findings: Secondary | ICD-10-CM | POA: Diagnosis not present

## 2017-10-09 DIAGNOSIS — I10 Essential (primary) hypertension: Secondary | ICD-10-CM | POA: Diagnosis not present

## 2017-10-09 DIAGNOSIS — R196 Halitosis: Secondary | ICD-10-CM | POA: Diagnosis not present

## 2017-10-09 DIAGNOSIS — F419 Anxiety disorder, unspecified: Secondary | ICD-10-CM | POA: Diagnosis not present

## 2017-10-16 DIAGNOSIS — W1800XA Striking against unspecified object with subsequent fall, initial encounter: Secondary | ICD-10-CM | POA: Diagnosis not present

## 2017-10-16 DIAGNOSIS — R0781 Pleurodynia: Secondary | ICD-10-CM | POA: Diagnosis not present

## 2017-10-17 DIAGNOSIS — N183 Chronic kidney disease, stage 3 (moderate): Secondary | ICD-10-CM | POA: Diagnosis not present

## 2017-10-17 DIAGNOSIS — I129 Hypertensive chronic kidney disease with stage 1 through stage 4 chronic kidney disease, or unspecified chronic kidney disease: Secondary | ICD-10-CM | POA: Diagnosis not present

## 2017-10-17 DIAGNOSIS — E785 Hyperlipidemia, unspecified: Secondary | ICD-10-CM | POA: Diagnosis not present

## 2017-10-17 DIAGNOSIS — R768 Other specified abnormal immunological findings in serum: Secondary | ICD-10-CM | POA: Diagnosis not present

## 2017-10-17 DIAGNOSIS — R972 Elevated prostate specific antigen [PSA]: Secondary | ICD-10-CM | POA: Diagnosis not present

## 2017-10-20 ENCOUNTER — Telehealth: Payer: Self-pay | Admitting: Radiation Oncology

## 2017-10-20 NOTE — Telephone Encounter (Signed)
Received two faxes from [facility] requesting a refill for this patient's tamsulosin. Responded via fax to [facility] on 10/05/2017 that this refill should come from the patient's urologist, Dr. Diona Fanti, since he hasn't been seen in our office since January. Phoned patient back and relayed this information. Encouraged patient to contact [facility] to ensure they are indeed faxing his refill request to [facility]. Patient verbalized understanding and expressed appreciation for the return call

## 2017-10-31 DIAGNOSIS — C61 Malignant neoplasm of prostate: Secondary | ICD-10-CM | POA: Diagnosis not present

## 2017-11-06 ENCOUNTER — Other Ambulatory Visit (HOSPITAL_COMMUNITY): Payer: Self-pay | Admitting: Nephrology

## 2017-11-06 DIAGNOSIS — N3281 Overactive bladder: Secondary | ICD-10-CM | POA: Diagnosis not present

## 2017-11-06 DIAGNOSIS — R809 Proteinuria, unspecified: Secondary | ICD-10-CM

## 2017-11-06 DIAGNOSIS — C61 Malignant neoplasm of prostate: Secondary | ICD-10-CM | POA: Diagnosis not present

## 2017-11-15 ENCOUNTER — Inpatient Hospital Stay: Payer: Medicare Other | Attending: Oncology

## 2017-11-15 DIAGNOSIS — D472 Monoclonal gammopathy: Secondary | ICD-10-CM | POA: Insufficient documentation

## 2017-11-15 DIAGNOSIS — Z79899 Other long term (current) drug therapy: Secondary | ICD-10-CM | POA: Diagnosis not present

## 2017-11-15 DIAGNOSIS — C61 Malignant neoplasm of prostate: Secondary | ICD-10-CM | POA: Insufficient documentation

## 2017-11-15 DIAGNOSIS — N289 Disorder of kidney and ureter, unspecified: Secondary | ICD-10-CM | POA: Diagnosis not present

## 2017-11-15 DIAGNOSIS — Z923 Personal history of irradiation: Secondary | ICD-10-CM | POA: Insufficient documentation

## 2017-11-15 LAB — CBC WITH DIFFERENTIAL (CANCER CENTER ONLY)
BASOS ABS: 0 10*3/uL (ref 0.0–0.1)
Basophils Relative: 1 %
Eosinophils Absolute: 0.2 10*3/uL (ref 0.0–0.5)
Eosinophils Relative: 6 %
HEMATOCRIT: 30.8 % — AB (ref 38.4–49.9)
Hemoglobin: 10.8 g/dL — ABNORMAL LOW (ref 13.0–17.1)
LYMPHS PCT: 14 %
Lymphs Abs: 0.5 10*3/uL — ABNORMAL LOW (ref 0.9–3.3)
MCH: 34.7 pg — ABNORMAL HIGH (ref 27.2–33.4)
MCHC: 35 g/dL (ref 32.0–36.0)
MCV: 99.2 fL — AB (ref 79.3–98.0)
MONO ABS: 0.5 10*3/uL (ref 0.1–0.9)
Monocytes Relative: 14 %
NEUTROS ABS: 2.3 10*3/uL (ref 1.5–6.5)
Neutrophils Relative %: 65 %
PLATELETS: 213 10*3/uL (ref 140–400)
RBC: 3.11 MIL/uL — ABNORMAL LOW (ref 4.20–5.82)
RDW: 12.5 % (ref 11.0–14.6)
WBC: 3.5 10*3/uL — AB (ref 4.0–10.3)

## 2017-11-15 LAB — CMP (CANCER CENTER ONLY)
ALK PHOS: 73 U/L (ref 38–126)
ALT: 13 U/L (ref 0–44)
ANION GAP: 9 (ref 5–15)
AST: 24 U/L (ref 15–41)
Albumin: 3.7 g/dL (ref 3.5–5.0)
BILIRUBIN TOTAL: 0.4 mg/dL (ref 0.3–1.2)
BUN: 28 mg/dL — ABNORMAL HIGH (ref 8–23)
CO2: 30 mmol/L (ref 22–32)
Calcium: 9.4 mg/dL (ref 8.9–10.3)
Chloride: 102 mmol/L (ref 98–111)
Creatinine: 1.5 mg/dL — ABNORMAL HIGH (ref 0.61–1.24)
GFR, EST AFRICAN AMERICAN: 49 mL/min — AB (ref >60)
GFR, EST NON AFRICAN AMERICAN: 43 mL/min — AB (ref >60)
Glucose, Bld: 109 mg/dL — ABNORMAL HIGH (ref 70–99)
POTASSIUM: 4.2 mmol/L (ref 3.5–5.1)
Sodium: 141 mmol/L (ref 135–145)
TOTAL PROTEIN: 7.1 g/dL (ref 6.5–8.1)

## 2017-11-16 LAB — MULTIPLE MYELOMA PANEL, SERUM
ALBUMIN SERPL ELPH-MCNC: 3.7 g/dL (ref 2.9–4.4)
Albumin/Glob SerPl: 1.3 (ref 0.7–1.7)
Alpha 1: 0.2 g/dL (ref 0.0–0.4)
Alpha2 Glob SerPl Elph-Mcnc: 0.7 g/dL (ref 0.4–1.0)
B-Globulin SerPl Elph-Mcnc: 0.9 g/dL (ref 0.7–1.3)
Gamma Glob SerPl Elph-Mcnc: 1.1 g/dL (ref 0.4–1.8)
Globulin, Total: 2.9 g/dL (ref 2.2–3.9)
IGA: 156 mg/dL (ref 61–437)
IGM (IMMUNOGLOBULIN M), SRM: 149 mg/dL — AB (ref 15–143)
IgG (Immunoglobin G), Serum: 1153 mg/dL (ref 700–1600)
Total Protein ELP: 6.6 g/dL (ref 6.0–8.5)

## 2017-11-16 LAB — KAPPA/LAMBDA LIGHT CHAINS
KAPPA FREE LGHT CHN: 66.8 mg/L — AB (ref 3.3–19.4)
KAPPA, LAMDA LIGHT CHAIN RATIO: 4 — AB (ref 0.26–1.65)
LAMDA FREE LIGHT CHAINS: 16.7 mg/L (ref 5.7–26.3)

## 2017-11-22 ENCOUNTER — Telehealth: Payer: Self-pay | Admitting: Oncology

## 2017-11-22 ENCOUNTER — Telehealth: Payer: Self-pay | Admitting: *Deleted

## 2017-11-22 ENCOUNTER — Inpatient Hospital Stay (HOSPITAL_BASED_OUTPATIENT_CLINIC_OR_DEPARTMENT_OTHER): Payer: Medicare Other | Admitting: Oncology

## 2017-11-22 VITALS — BP 128/69 | HR 60 | Temp 97.6°F | Resp 17 | Ht 67.5 in | Wt 139.1 lb

## 2017-11-22 DIAGNOSIS — C61 Malignant neoplasm of prostate: Secondary | ICD-10-CM | POA: Diagnosis not present

## 2017-11-22 DIAGNOSIS — Z79899 Other long term (current) drug therapy: Secondary | ICD-10-CM | POA: Diagnosis not present

## 2017-11-22 DIAGNOSIS — Z923 Personal history of irradiation: Secondary | ICD-10-CM | POA: Diagnosis not present

## 2017-11-22 DIAGNOSIS — N289 Disorder of kidney and ureter, unspecified: Secondary | ICD-10-CM

## 2017-11-22 DIAGNOSIS — D472 Monoclonal gammopathy: Secondary | ICD-10-CM | POA: Diagnosis not present

## 2017-11-22 NOTE — Telephone Encounter (Signed)
Scheduled appt per 9/11 los - per patient request end of March due to being out of town . Gave patient AVS and calender per los.

## 2017-11-22 NOTE — Telephone Encounter (Signed)
Patient called to say he was on 2 new meds. Added to his med list

## 2017-11-22 NOTE — Progress Notes (Signed)
Hematology and Oncology Follow Up Visit  Darrell Hernandez [id number] May 11, 1935 82 y.o. 11/22/2017 10:24 AM Darrell Hernandez, MDKim, Jeneen Rinks, MD   Principle Diagnosis: 82 year old man with the:  1. Prostate cancer presented with a Gleason score 4+ 5 = PSA 4.7 diagnosed in May 2018.   2.  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 He was found to have elevated serum light chains with elevated free kappa light chain of 52.7 and elevated Kappa to lambda ratio of 3.34 today in February 2019.     Prior Therapy:   Status post radiation therapy between 12/28/2016 and 02/24/2017. He received total of 75 gray to the prostate, seminal vesicle and lymph nodes.  Current therapy:  Androgen deprivation therapy to complete 2 years under the care of Dr. Diona Fanti.  Active surveillance for his plasma cell disorder.  Interim History:  Darrell Hernandez a follow-up visit.  Since the last visit, he reports no major changes or complaints.  Continues to be active and attends activities of daily living.  He tolerated radiation and androgen deprivation for his prostate cancer without any concerns.  He denies any urination difficulties.  He denies any bone pain or pathological fractures.  Continues to do quality of life.  He denies any dysuria or hematuria.  He denies any recurrent infections.    He does not report any headaches, blurry vision, syncope or seizures.  He denies any dizziness or alteration in mental status.  Does not report any fevers, chills or sweats.  Does not report any cough, wheezing or hemoptysis.  Does not report any chest pain, palpitation, orthopnea or leg edema.  Does not report any nausea, vomiting.  Denies any hematochezia or melena. Does not report any changes in his bowel habits. Does not report any arthralgias or myalgias.  Does not report any skin rashes or lesions.   Does not report any lymphadenopathy or petechiae.  Does not report any changes in his mood.   Remaining review of systems is negative.    Medications: I have reviewed the patient's current medications.  Current Outpatient Medications  Medication Sig Dispense Refill  . amLODipine (NORVASC) 10 MG tablet Take 5 mg by mouth at bedtime.     . fosinopril (MONOPRIL) 40 MG tablet Take 60 mg by mouth every evening. TAKES ONE AND HALF TABLETS = 60MG    . hydrochlorothiazide (HYDRODIURIL) 25 MG tablet Take 25 mg by mouth daily.    Marland Kitchen LORazepam (ATIVAN) 1 MG tablet Take 0.5-1 mg by mouth every 6 (six) hours as needed.     . metoprolol tartrate (LOPRESSOR) 25 MG tablet Take 25 mg by mouth at bedtime.     . Multiple Vitamins-Minerals (ZINC PO) Take by mouth daily.    Marland Kitchen PARoxetine (PAXIL) 20 MG tablet Take 20 mg by mouth every evening.    . ranitidine (ZANTAC) 75 MG tablet Take 75 mg by mouth daily as needed for heartburn.    . tamsulosin (FLOMAX) 0.4 MG CAPS capsule Take 1 capsule (0.4 mg total) by mouth daily after supper. 30 capsule 5   No current facility-administered medications for this visit.      Allergies:  Allergies  Allergen Reactions  . Other Shortness Of Breath and Swelling    ALL NUTS (THE MOST SEVERE IS PEANUTS)    Past Medical History, Surgical history, Social history, and Family History were reviewed and updated.    Physical Exam: Blood pressure 128/69, pulse 60, temperature 97.6 F (36.4 C), temperature source  Oral, resp. rate 17, height 5' 7.5" (1.715 m), weight 139 lb 1.6 oz (63.1 kg), SpO2 100 %.   ECOG: 0   General appearance: Comfortable appearing without any discomfort Head: Normocephalic without any trauma Oropharynx: Mucous membranes are moist and pink without any thrush or ulcers. Eyes: Pupils are equal and round reactive to light. Lymph nodes: No cervical, supraclavicular, inguinal or axillary lymphadenopathy.   Heart:regular rate and rhythm.  S1 and S2 without leg edema. Lung: Clear without any rhonchi or wheezes.  No dullness to percussion. Abdomin:  Soft, nontender, nondistended with good bowel sounds.  No hepatosplenomegaly. Musculoskeletal: No joint deformity or effusion.  Full range of motion noted. Neurological: No deficits noted on motor, sensory and deep tendon reflex exam. Skin: No petechial rash or dryness.  Appeared moist.      Lab Results: Lab Results  Component Value Date   WBC 3.5 (L) 11/15/2017   HGB 10.8 (L) 11/15/2017   HCT 30.8 (L) 11/15/2017   MCV 99.2 (H) 11/15/2017   PLT 213 11/15/2017     Chemistry      Component Value Date/Time   NA 141 11/15/2017 1115   K 4.2 11/15/2017 1115   CL 102 11/15/2017 1115   CO2 30 11/15/2017 1115   BUN 28 (H) 11/15/2017 1115   CREATININE 1.50 (H) 11/15/2017 1115      Component Value Date/Time   CALCIUM 9.4 11/15/2017 1115   ALKPHOS 73 11/15/2017 1115   AST 24 11/15/2017 1115   ALT 13 11/15/2017 1115   BILITOT 0.4 11/15/2017 1115     Results for [name], [name] "BOB" (MRN [id number]) as of 11/22/2017 10:27  Ref. Range 11/15/2017 11:15  Kappa free light chain Latest Ref Range: 3.3 - 19.4 mg/L 66.8 (H)  Lamda free light chains Latest Ref Range: 5.7 - 26.3 mg/L 16.7  Kappa, lamda light chain ratio Latest Ref Range: 0.26 - 1.65  4.00 (H)    Impression and Plan:   82 year old gentleman with the following issues:  1.  Kappa light chain monoclonal gammopathy detected in February 2019.  These findings suggest monoclonal gammopathy of undetermined significance versus reactive findings.  Laboratory data obtained on 11/15/2017 were personally reviewed and discussed with the patient.  He is kappa free light chain remains elevated with kappa to lambda ratio that is not dramatically different than what it was earlier this year.  The natural course of this findings as well as the differential diagnosis was reviewed.  Active multiple myeloma with renal involvement is also a consideration but considered less likely.  I recommended continue active surveillance unless his kidney  biopsy suggest light chain disease.  At that time I would recommend a bone marrow biopsy and consideration for treatment at the time.  If his kidney biopsy do not suggest light chain disease, think periodic monitoring of his light chains would be recommended.  2. Prostate cancer: He is a Gleason score 4+5 = 9 and a PSA of 4.7.  He continues to receive androgen deprivation therapy to complete 2 years around 2020.  He status post radiation therapy as well.  3.  Renal insufficiency: His creatinine is 1.5 which is unchanged in the last year.  He reports in the mid 90s his creatinine was up to 1.4 at times.  He is following up with Dr. Idolina Primer regarding this issue and is scheduled to have a renal biopsy.  4. Follow-up: Will be in 6 months follow his progress and repeat laboratory testing.  15  minutes was spent with the patient face-to-face today.  More than 50% of time was dedicated to the natural course of his disease, reviewing laboratory data and coordinating his plan of care.   Zola Button, MD 9/11/201910:24 AM

## 2017-11-29 ENCOUNTER — Other Ambulatory Visit: Payer: Self-pay | Admitting: Physician Assistant

## 2017-11-29 ENCOUNTER — Other Ambulatory Visit: Payer: Self-pay | Admitting: Radiology

## 2017-11-30 ENCOUNTER — Ambulatory Visit (HOSPITAL_COMMUNITY)
Admission: RE | Admit: 2017-11-30 | Discharge: 2017-11-30 | Disposition: A | Discharge status: Home / Self Care | Payer: Medicare Other | Source: Ambulatory Visit | Attending: Nephrology | Admitting: Nephrology

## 2017-11-30 ENCOUNTER — Encounter (HOSPITAL_COMMUNITY): Payer: Self-pay

## 2017-11-30 DIAGNOSIS — N289 Disorder of kidney and ureter, unspecified: Secondary | ICD-10-CM | POA: Diagnosis not present

## 2017-11-30 DIAGNOSIS — Z79899 Other long term (current) drug therapy: Secondary | ICD-10-CM | POA: Diagnosis not present

## 2017-11-30 DIAGNOSIS — R801 Persistent proteinuria, unspecified: Secondary | ICD-10-CM | POA: Diagnosis not present

## 2017-11-30 DIAGNOSIS — K219 Gastro-esophageal reflux disease without esophagitis: Secondary | ICD-10-CM | POA: Insufficient documentation

## 2017-11-30 DIAGNOSIS — E785 Hyperlipidemia, unspecified: Secondary | ICD-10-CM | POA: Diagnosis not present

## 2017-11-30 DIAGNOSIS — N269 Renal sclerosis, unspecified: Secondary | ICD-10-CM | POA: Diagnosis not present

## 2017-11-30 DIAGNOSIS — Z8546 Personal history of malignant neoplasm of prostate: Secondary | ICD-10-CM | POA: Insufficient documentation

## 2017-11-30 DIAGNOSIS — N189 Chronic kidney disease, unspecified: Secondary | ICD-10-CM | POA: Diagnosis not present

## 2017-11-30 DIAGNOSIS — R809 Proteinuria, unspecified: Secondary | ICD-10-CM | POA: Diagnosis not present

## 2017-11-30 LAB — CBC
HCT: 33.2 % — ABNORMAL LOW (ref 39.0–52.0)
Hemoglobin: 11.1 g/dL — ABNORMAL LOW (ref 13.0–17.0)
MCH: 33.7 pg (ref 26.0–34.0)
MCHC: 33.4 g/dL (ref 30.0–36.0)
MCV: 100.9 fL — AB (ref 78.0–100.0)
PLATELETS: 191 10*3/uL (ref 150–400)
RBC: 3.29 MIL/uL — ABNORMAL LOW (ref 4.22–5.81)
RDW: 11.6 % (ref 11.5–15.5)
WBC: 4 10*3/uL (ref 4.0–10.5)

## 2017-11-30 LAB — PROTIME-INR
INR: 0.9
Prothrombin Time: 12 seconds (ref 11.4–15.2)

## 2017-11-30 MED ORDER — LIDOCAINE HCL (PF) 1 % IJ SOLN
INTRAMUSCULAR | Status: AC
  Filled 2017-11-30: qty 30

## 2017-11-30 MED ORDER — MIDAZOLAM HCL 2 MG/2ML IJ SOLN
INTRAMUSCULAR | Status: AC
  Filled 2017-11-30: qty 2

## 2017-11-30 MED ORDER — MIDAZOLAM HCL 2 MG/2ML IJ SOLN
INTRAMUSCULAR | Status: AC | PRN
  Administered 2017-11-30: 1 mg via INTRAVENOUS
  Administered 2017-11-30: 0.5 mg via INTRAVENOUS

## 2017-11-30 MED ORDER — FENTANYL CITRATE (PF) 100 MCG/2ML IJ SOLN
INTRAMUSCULAR | Status: AC | PRN
  Administered 2017-11-30 (×2): 25 ug via INTRAVENOUS

## 2017-11-30 MED ORDER — FENTANYL CITRATE (PF) 100 MCG/2ML IJ SOLN
INTRAMUSCULAR | Status: AC
  Filled 2017-11-30: qty 2

## 2017-11-30 MED ORDER — SODIUM CHLORIDE 0.9 % IV SOLN: INTRAVENOUS | Status: DC

## 2017-11-30 NOTE — Discharge Instructions (Addendum)

## 2017-11-30 NOTE — Sedation Documentation (Signed)
Called to give report. Nurse unavailable. Will call back 

## 2017-11-30 NOTE — H&P (Signed)
Chief Complaint: Patient was seen in consultation today for random renal biopsy at the request of [facility]  Referring Physician(s): [place]  Supervising Physician: Aletta Edouard  Patient Status: [facility] - Out-pt  History of Present Illness: Darrell Hernandez. is a 82 y.o. male   Hx Prostate cancer HTN; HLD Proteinuria-- persistent; elevating Follows with Dr Lorrene Reid Rising creatinine  Scheduled for random renal biopsy Pt states he is aware of a "cyst" on kidney Rt Imaged 2015  Past Medical History:  Diagnosis Date  . GERD (gastroesophageal reflux disease)   . History of cardiac murmur as a child   . History of idiopathic seizure    per pt age 35 or 75 had 2 seizure's , unknown cause, and no seizure's since  . Hyperplasia of prostate with lower urinary tract symptoms (LUTS)   . Hypertension   . Prostate cancer ([place]) UROLOGIST-  DR DAHLSTEDT/  ONCOLOGIST-  DR MANNING/ DR Alen Blew   dx 08-10-2016 (bx)  Stage T2a,  Gleason 4+5,  PSA 6.1,  vol 38.85cc--- plan external beam radiation  . Wears glasses     Past Surgical History:  Procedure Laterality Date  . CATARACT EXTRACTION W/ INTRAOCULAR LENS IMPLANT Right 2015  . COLONOSCOPY  last one 2017  . EYE SURGERY Right 2010   removal epiretinal membrane  . GOLD SEED IMPLANT N/A 12/09/2016   Procedure: GOLD SEED IMPLANT;  Surgeon: Franchot Gallo, MD;  Location: [facility];  Service: Urology;  Laterality: N/A;  . INGUINAL HERNIA REPAIR Bilateral 1980s;  1990  . SPACE OAR INSTILLATION N/A 12/09/2016   Procedure: SPACE OAR INSTILLATION;  Surgeon: Franchot Gallo, MD;  Location: [facility];  Service: Urology;  Laterality: N/A;  . TONSILLECTOMY  child    Allergies: Other  Medications: Prior to Admission medications   Medication Sig Start Date End Date Taking? Authorizing Provider  amLODipine (NORVASC) 10 MG tablet Take 5 mg by mouth at bedtime.  06/29/16  Yes [provider]  Ferrous Sulfate (IRON) 28 MG TABS Take 28 mg by mouth once a week.   Yes [provider]  fosinopril (MONOPRIL) 40 MG tablet Take 20-40 mg by mouth See admin instructions. Take 20 mg in the morning and 40 mg in the evening 06/30/16  Yes [provider]  hydrochlorothiazide (MICROZIDE) 12.5 MG capsule Take 12.5 mg by mouth daily.   Yes [provider]  LORazepam (ATIVAN) 1 MG tablet Take 0.5 mg by mouth at bedtime as needed for anxiety or sleep.  06/30/16  Yes [provider]  metoprolol tartrate (LOPRESSOR) 25 MG tablet Take 25 mg by mouth every evening.  06/29/16  Yes [provider]  MINOXIDIL EX Apply 1 application topically 2 (two) times daily.   Yes [provider]  Multiple Vitamin (MULTIVITAMIN WITH MINERALS) TABS tablet Take 1 tablet by mouth 2 (two) times a week.   Yes [provider]  oxybutynin (DITROPAN) 5 MG tablet Take 5 mg by mouth 2 (two) times daily as needed for bladder spasms.    Yes [provider]  PARoxetine (PAXIL) 20 MG tablet Take 20 mg by mouth every evening.   Yes [provider]  ranitidine (ZANTAC) 150 MG tablet Take 150 mg by mouth daily as needed for heartburn.   Yes [provider]  tamsulosin (FLOMAX) 0.4 MG CAPS capsule Take 1 capsule (0.4 mg total) by mouth daily after supper. Patient taking differently: Take 0.4 mg by mouth daily as needed (  urinary flow). In the evening 12/30/16  Yes Tyler Pita, MD  Zinc 15 MG CAPS Take 15 mg by mouth daily.   Yes [provider]  leuprolide (LUPRON) 11.25 MG injection Inject into the muscle every 4 (four) months. No dose given by patient    [provider]     History reviewed. No pertinent family history.  Social History   Socioeconomic History  . Marital status: Married    Spouse name: Not on file  . Number of children: Not on file  . Years of education: Not on file  . Highest education level:  Not on file  Occupational History  . Not on file  Social Needs  . Financial resource strain: Not on file  . Food insecurity:    Worry: Not on file    Inability: Not on file  . Transportation needs:    Medical: Not on file    Non-medical: Not on file  Tobacco Use  . Smoking status: Never Smoker  . Smokeless tobacco: Never Used  Substance and Sexual Activity  . Alcohol use: Yes    Alcohol/week: 2.0 standard drinks    Types: 2 Shots of liquor per week    Comment: OCCASIONAL  . Drug use: No  . Sexual activity: Not on file  Lifestyle  . Physical activity:    Days per week: Not on file    Minutes per session: Not on file  . Stress: Not on file  Relationships  . Social connections:    Talks on phone: Not on file    Gets together: Not on file    Attends religious service: Not on file    Active member of club or organization: Not on file    Attends meetings of clubs or organizations: Not on file    Relationship status: Not on file  Other Topics Concern  . Not on file  Social History Narrative  . Not on file     Review of Systems: A 12 point ROS discussed and pertinent positives are indicated in the HPI above.  All other systems are negative.  Review of Systems  Constitutional: Negative for activity change, fatigue and fever.  Respiratory: Negative for shortness of breath.   Gastrointestinal: Negative for abdominal pain.  Genitourinary: Negative for difficulty urinating and flank pain.  Musculoskeletal: Negative for back pain.  Neurological: Negative for weakness.  Psychiatric/Behavioral: Negative for behavioral problems and confusion.    Vital Signs: BP 134/75   Pulse (!) 58   Temp 97.8 F (36.6 C) (Oral)   Resp 16   Ht 5' 6.5" (1.689 m)   Wt 140 lb (63.5 kg)   SpO2 100%   BMI 22.26 kg/m   Physical Exam  Constitutional: He is oriented to person, place, and time.  Cardiovascular: Normal rate and regular rhythm.  Pulmonary/Chest: Effort normal and breath  sounds normal.  Abdominal: Soft. Bowel sounds are normal.  Musculoskeletal: Normal range of motion.  Neurological: He is alert and oriented to person, place, and time.  Skin: Skin is warm and dry.  Psychiatric: He has a normal mood and affect. His behavior is normal. Judgment and thought content normal.  Vitals reviewed.   Imaging: No results found.  Labs:  CBC: Recent Labs    12/09/16 1111 11/15/17 1115 11/30/17 0556  WBC  --  3.5* 4.0  HGB 11.9* 10.8* 11.1*  HCT 35.0* 30.8* 33.2*  PLT  --  213 191    COAGS: Recent Labs  11/30/17 0556  INR 0.90    BMP: Recent Labs    12/09/16 1111 11/15/17 1115  NA 141 141  K 5.9* 4.2  CL 105 102  CO2  --  30  GLUCOSE 99 109*  BUN 34* 28*  CALCIUM  --  9.4  CREATININE 1.50* 1.50*  GFRNONAA  --  43*  GFRAA  --  49*    LIVER FUNCTION TESTS: Recent Labs    11/15/17 1115  BILITOT 0.4  AST 24  ALT 13  ALKPHOS 73  PROT 7.1  ALBUMIN 3.7    TUMOR MARKERS: No results for input(s): AFPTM, CEA, CA199, CHROMGRNA in the last 8760 hours.  Assessment and Plan:  Hx prostate ca Follows with Dr Lorrene Reid for proteinuria Rising creatinine Now scheduled for random renal biopsy Risks and benefits discussed with the patient including, but not limited to bleeding, infection, damage to adjacent structures or low yield requiring additional tests.  All of the patient's questions were answered, patient is agreeable to proceed. Consent signed and in chart.  Thank you for this interesting consult.  I greatly enjoyed meeting Darrell Hernandez. and look forward to participating in their care.  A copy of this report was sent to the requesting provider on this date.  Electronically Signed: Lavonia Drafts, PA-C 11/30/2017, 7:15 AM   I spent a total of  30 Minutes   in face to face in clinical consultation, greater than 50% of which was counseling/coordinating care for random renal biopsy

## 2017-11-30 NOTE — Procedures (Signed)
Interventional Radiology Procedure Note  Procedure: US guided renal biopsy  Complications: None  Estimated Blood Loss: None  Findings: 16 G core biopsy x 2 performed at level of left lower renal cortex  Adalene Gulotta T. Kathlene Cote, M.D Pager:  [phone]

## 2017-12-14 ENCOUNTER — Encounter (HOSPITAL_COMMUNITY): Payer: Self-pay | Admitting: Nephrology

## 2017-12-21 ENCOUNTER — Encounter (HOSPITAL_COMMUNITY): Payer: Self-pay | Admitting: Nephrology

## 2017-12-21 DIAGNOSIS — E785 Hyperlipidemia, unspecified: Secondary | ICD-10-CM | POA: Diagnosis not present

## 2017-12-21 DIAGNOSIS — D649 Anemia, unspecified: Secondary | ICD-10-CM | POA: Diagnosis not present

## 2017-12-21 DIAGNOSIS — R972 Elevated prostate specific antigen [PSA]: Secondary | ICD-10-CM | POA: Diagnosis not present

## 2017-12-21 DIAGNOSIS — N051 Unspecified nephritic syndrome with focal and segmental glomerular lesions: Secondary | ICD-10-CM | POA: Diagnosis not present

## 2017-12-21 DIAGNOSIS — I129 Hypertensive chronic kidney disease with stage 1 through stage 4 chronic kidney disease, or unspecified chronic kidney disease: Secondary | ICD-10-CM | POA: Diagnosis not present

## 2017-12-21 DIAGNOSIS — N183 Chronic kidney disease, stage 3 (moderate): Secondary | ICD-10-CM | POA: Diagnosis not present

## 2017-12-21 DIAGNOSIS — R768 Other specified abnormal immunological findings in serum: Secondary | ICD-10-CM | POA: Diagnosis not present

## 2017-12-29 DIAGNOSIS — Z23 Encounter for immunization: Secondary | ICD-10-CM | POA: Diagnosis not present

## 2018-01-02 DIAGNOSIS — Z85828 Personal history of other malignant neoplasm of skin: Secondary | ICD-10-CM | POA: Diagnosis not present

## 2018-01-02 DIAGNOSIS — L814 Other melanin hyperpigmentation: Secondary | ICD-10-CM | POA: Diagnosis not present

## 2018-01-02 DIAGNOSIS — L821 Other seborrheic keratosis: Secondary | ICD-10-CM | POA: Diagnosis not present

## 2018-01-03 ENCOUNTER — Telehealth: Payer: Self-pay | Admitting: Oncology

## 2018-01-03 NOTE — Telephone Encounter (Signed)
Patient called to reschedule  °

## 2018-02-28 DIAGNOSIS — H2512 Age-related nuclear cataract, left eye: Secondary | ICD-10-CM | POA: Diagnosis not present

## 2018-02-28 DIAGNOSIS — Z961 Presence of intraocular lens: Secondary | ICD-10-CM | POA: Diagnosis not present

## 2018-02-28 DIAGNOSIS — Z9889 Other specified postprocedural states: Secondary | ICD-10-CM | POA: Diagnosis not present

## 2018-03-01 DIAGNOSIS — E785 Hyperlipidemia, unspecified: Secondary | ICD-10-CM | POA: Diagnosis not present

## 2018-03-01 DIAGNOSIS — R768 Other specified abnormal immunological findings in serum: Secondary | ICD-10-CM | POA: Diagnosis not present

## 2018-03-01 DIAGNOSIS — D649 Anemia, unspecified: Secondary | ICD-10-CM | POA: Diagnosis not present

## 2018-03-01 DIAGNOSIS — N183 Chronic kidney disease, stage 3 (moderate): Secondary | ICD-10-CM | POA: Diagnosis not present

## 2018-03-01 DIAGNOSIS — N051 Unspecified nephritic syndrome with focal and segmental glomerular lesions: Secondary | ICD-10-CM | POA: Diagnosis not present

## 2018-03-01 DIAGNOSIS — R972 Elevated prostate specific antigen [PSA]: Secondary | ICD-10-CM | POA: Diagnosis not present

## 2018-03-01 DIAGNOSIS — I129 Hypertensive chronic kidney disease with stage 1 through stage 4 chronic kidney disease, or unspecified chronic kidney disease: Secondary | ICD-10-CM | POA: Diagnosis not present

## 2018-03-19 DIAGNOSIS — C61 Malignant neoplasm of prostate: Secondary | ICD-10-CM | POA: Diagnosis not present

## 2018-03-23 DIAGNOSIS — C61 Malignant neoplasm of prostate: Secondary | ICD-10-CM | POA: Diagnosis not present

## 2018-03-27 DIAGNOSIS — F419 Anxiety disorder, unspecified: Secondary | ICD-10-CM | POA: Diagnosis not present

## 2018-03-27 DIAGNOSIS — I10 Essential (primary) hypertension: Secondary | ICD-10-CM | POA: Diagnosis not present

## 2018-04-04 DIAGNOSIS — N183 Chronic kidney disease, stage 3 (moderate): Secondary | ICD-10-CM | POA: Diagnosis not present

## 2018-04-04 DIAGNOSIS — F419 Anxiety disorder, unspecified: Secondary | ICD-10-CM | POA: Diagnosis not present

## 2018-04-04 DIAGNOSIS — E7800 Pure hypercholesterolemia, unspecified: Secondary | ICD-10-CM | POA: Diagnosis not present

## 2018-04-04 DIAGNOSIS — D638 Anemia in other chronic diseases classified elsewhere: Secondary | ICD-10-CM | POA: Diagnosis not present

## 2018-04-04 DIAGNOSIS — Z0000 Encounter for general adult medical examination without abnormal findings: Secondary | ICD-10-CM | POA: Diagnosis not present

## 2018-04-04 DIAGNOSIS — I10 Essential (primary) hypertension: Secondary | ICD-10-CM | POA: Diagnosis not present

## 2018-05-30 ENCOUNTER — Other Ambulatory Visit: Payer: Medicare Other

## 2018-06-06 ENCOUNTER — Ambulatory Visit: Payer: Medicare Other | Admitting: Oncology

## 2018-06-11 ENCOUNTER — Telehealth: Payer: Self-pay | Admitting: Oncology

## 2018-06-11 NOTE — Telephone Encounter (Signed)
Called patient back to r/s appt per 3/30 sch message-

## 2018-06-12 ENCOUNTER — Telehealth: Payer: Self-pay | Admitting: Oncology

## 2018-06-12 ENCOUNTER — Other Ambulatory Visit: Payer: Medicare Other

## 2018-06-12 NOTE — Telephone Encounter (Signed)
Patient's updated calender will be mailed.  °

## 2018-06-19 ENCOUNTER — Ambulatory Visit: Payer: Medicare Other | Admitting: Oncology

## 2018-08-09 DIAGNOSIS — C61 Malignant neoplasm of prostate: Secondary | ICD-10-CM | POA: Diagnosis not present

## 2018-08-15 DIAGNOSIS — C61 Malignant neoplasm of prostate: Secondary | ICD-10-CM | POA: Diagnosis not present

## 2018-08-15 DIAGNOSIS — N3281 Overactive bladder: Secondary | ICD-10-CM | POA: Diagnosis not present

## 2018-09-04 DIAGNOSIS — L814 Other melanin hyperpigmentation: Secondary | ICD-10-CM | POA: Diagnosis not present

## 2018-09-04 DIAGNOSIS — L570 Actinic keratosis: Secondary | ICD-10-CM | POA: Diagnosis not present

## 2018-09-04 DIAGNOSIS — L821 Other seborrheic keratosis: Secondary | ICD-10-CM | POA: Diagnosis not present

## 2018-09-04 DIAGNOSIS — Z85828 Personal history of other malignant neoplasm of skin: Secondary | ICD-10-CM | POA: Diagnosis not present

## 2018-09-13 ENCOUNTER — Inpatient Hospital Stay (HOSPITAL_BASED_OUTPATIENT_CLINIC_OR_DEPARTMENT_OTHER): Payer: Medicare Other | Admitting: Oncology

## 2018-09-13 ENCOUNTER — Other Ambulatory Visit: Payer: Self-pay

## 2018-09-13 ENCOUNTER — Inpatient Hospital Stay: Payer: Medicare Other | Attending: Oncology

## 2018-09-13 VITALS — BP 140/79 | HR 58 | Temp 98.4°F | Resp 17 | Ht 66.5 in | Wt 138.5 lb

## 2018-09-13 DIAGNOSIS — Z8546 Personal history of malignant neoplasm of prostate: Secondary | ICD-10-CM | POA: Insufficient documentation

## 2018-09-13 DIAGNOSIS — Z923 Personal history of irradiation: Secondary | ICD-10-CM

## 2018-09-13 DIAGNOSIS — D472 Monoclonal gammopathy: Secondary | ICD-10-CM | POA: Insufficient documentation

## 2018-09-13 DIAGNOSIS — N289 Disorder of kidney and ureter, unspecified: Secondary | ICD-10-CM | POA: Diagnosis not present

## 2018-09-13 DIAGNOSIS — Z79899 Other long term (current) drug therapy: Secondary | ICD-10-CM | POA: Insufficient documentation

## 2018-09-13 LAB — CMP (CANCER CENTER ONLY)
ALT: 15 U/L (ref 0–44)
AST: 22 U/L (ref 15–41)
Albumin: 3.8 g/dL (ref 3.5–5.0)
Alkaline Phosphatase: 76 U/L (ref 38–126)
Anion gap: 10 (ref 5–15)
BUN: 35 mg/dL — ABNORMAL HIGH (ref 8–23)
CO2: 24 mmol/L (ref 22–32)
Calcium: 8.8 mg/dL — ABNORMAL LOW (ref 8.9–10.3)
Chloride: 105 mmol/L (ref 98–111)
Creatinine: 1.59 mg/dL — ABNORMAL HIGH (ref 0.61–1.24)
GFR, Est AFR Am: 47 mL/min — ABNORMAL LOW (ref >60)
GFR, Estimated: 40 mL/min — ABNORMAL LOW (ref >60)
Glucose, Bld: 84 mg/dL (ref 70–99)
Potassium: 4.2 mmol/L (ref 3.5–5.1)
Sodium: 139 mmol/L (ref 135–145)
Total Bilirubin: 0.3 mg/dL (ref 0.3–1.2)
Total Protein: 7.3 g/dL (ref 6.5–8.1)

## 2018-09-13 LAB — CBC WITH DIFFERENTIAL (CANCER CENTER ONLY)
Abs Immature Granulocytes: 0.01 10*3/uL (ref 0.00–0.07)
Basophils Absolute: 0 10*3/uL (ref 0.0–0.1)
Basophils Relative: 1 %
Eosinophils Absolute: 0.5 10*3/uL (ref 0.0–0.5)
Eosinophils Relative: 12 %
HCT: 32.5 % — ABNORMAL LOW (ref 39.0–52.0)
Hemoglobin: 10.8 g/dL — ABNORMAL LOW (ref 13.0–17.0)
Immature Granulocytes: 0 %
Lymphocytes Relative: 16 %
Lymphs Abs: 0.6 10*3/uL — ABNORMAL LOW (ref 0.7–4.0)
MCH: 33.6 pg (ref 26.0–34.0)
MCHC: 33.2 g/dL (ref 30.0–36.0)
MCV: 101.2 fL — ABNORMAL HIGH (ref 80.0–100.0)
Monocytes Absolute: 0.6 10*3/uL (ref 0.1–1.0)
Monocytes Relative: 15 %
Neutro Abs: 2.3 10*3/uL (ref 1.7–7.7)
Neutrophils Relative %: 56 %
Platelet Count: 196 10*3/uL (ref 150–400)
RBC: 3.21 MIL/uL — ABNORMAL LOW (ref 4.22–5.81)
RDW: 11.7 % (ref 11.5–15.5)
WBC Count: 4 10*3/uL (ref 4.0–10.5)
nRBC: 0 % (ref 0.0–0.2)

## 2018-09-13 NOTE — Progress Notes (Signed)
Hematology and Oncology Follow Up Visit  Darrell Hernandez [id number] 05-21-1935 83 y.o. 09/13/2018 3:29 PM Darrell Hernandez, MDKim, Darrell Rinks, MD   Principle Diagnosis: 83 year old man with the:  1. Prostate cancer diagnosed in May 2018.  He was found to have Gleason score 4+ 5 = PSA 4.7.  He completed definitive therapy.  2.  Kappa light chain monoclonal gammopathy without any evidence of plasma cell disorder.  Kidney biopsy in September 2019 rule out light chain disease.     Prior Therapy:   Status post radiation therapy between 12/28/2016 and 02/24/2017. He received total of 75 gray to the prostate, seminal vesicle and lymph nodes.  He completed 18 months of androgen deprivation under the care of Dr. Diona Fanti.  Current therapy: Active surveillance.   Interim History:  Darrell Hernandez returns today for a repeat evaluation.  Since the last visit, he remains without any complaints.  Continues to be active and attends activities of daily living.  He completed the kidney biopsy in September 2019 which did not show any light chain myeloma.  He denies any recent hospitalization or illnesses.  He denies any bone pain or pathological fractures.  He completed androgen deprivation therapy earlier this year without any issues.   He denied any alteration mental status, neuropathy, confusion or dizziness.  Denies any headaches or lethargy.  Denies any night sweats, weight loss or changes in appetite.  Denied orthopnea, dyspnea on exertion or chest discomfort.  Denies shortness of breath, difficulty breathing hemoptysis or cough.  Denies any abdominal distention, nausea, early satiety or dyspepsia.  Denies any hematuria, frequency, dysuria or nocturia.  Denies any skin irritation, dryness or rash.  Denies any ecchymosis or petechiae.  Denies any lymphadenopathy or clotting.  Denies any heat or cold intolerance.  Denies any anxiety or depression.  Remaining review of system is negative.        Medications: I  have reviewed the patient's current medications.  Current Outpatient Medications  Medication Sig Dispense Refill  . amLODipine (NORVASC) 10 MG tablet Take 5 mg by mouth at bedtime.     . Ferrous Sulfate (IRON) 28 MG TABS Take 28 mg by mouth once a week.    . fosinopril (MONOPRIL) 40 MG tablet Take 20-40 mg by mouth See admin instructions. Take 20 mg in the morning and 40 mg in the evening    . hydrochlorothiazide (MICROZIDE) 12.5 MG capsule Take 12.5 mg by mouth daily.    Marland Kitchen leuprolide (LUPRON) 11.25 MG injection Inject into the muscle every 4 (four) months. No dose given by patient    . LORazepam (ATIVAN) 1 MG tablet Take 0.5 mg by mouth at bedtime as needed for anxiety or sleep.     . metoprolol tartrate (LOPRESSOR) 25 MG tablet Take 25 mg by mouth every evening.     Marland Kitchen MINOXIDIL EX Apply 1 application topically 2 (two) times daily.    . Multiple Vitamin (MULTIVITAMIN WITH MINERALS) TABS tablet Take 1 tablet by mouth 2 (two) times a week.    Marland Kitchen oxybutynin (DITROPAN) 5 MG tablet Take 5 mg by mouth 2 (two) times daily as needed for bladder spasms.     Marland Kitchen PARoxetine (PAXIL) 20 MG tablet Take 20 mg by mouth every evening.    . ranitidine (ZANTAC) 150 MG tablet Take 150 mg by mouth daily as needed for heartburn.    . tamsulosin (FLOMAX) 0.4 MG CAPS capsule Take 1 capsule (0.4 mg total) by mouth daily after supper. (Patient taking  differently: Take 0.4 mg by mouth daily as needed (urinary flow). In the evening) 30 capsule 5  . Zinc 15 MG CAPS Take 15 mg by mouth daily.     No current facility-administered medications for this visit.      Allergies:  Allergies  Allergen Reactions  . Other Shortness Of Breath and Swelling    ALL NUTS (THE MOST SEVERE IS PEANUTS)    Past Medical History, Surgical history, Social history, and Family History were reviewed and updated.    Physical Exam:   ECOG: 0    General appearance: Alert, awake without any distress. Head: Atraumatic without  abnormalities Oropharynx: Without any thrush or ulcers. Eyes: No scleral icterus. Lymph nodes: No lymphadenopathy noted in the cervical, supraclavicular, or axillary nodes Heart:regular rate and rhythm, without any murmurs or gallops.   Lung: Clear to auscultation without any rhonchi, wheezes or dullness to percussion. Abdomin: Soft, nontender without any shifting dullness or ascites. Musculoskeletal: No clubbing or cyanosis. Neurological: No motor or sensory deficits. Skin: No rashes or lesions.       Lab Results: Lab Results  Component Value Date   WBC 4.0 11/30/2017   HGB 11.1 (L) 11/30/2017   HCT 33.2 (L) 11/30/2017   MCV 100.9 (H) 11/30/2017   PLT 191 11/30/2017     Chemistry      Component Value Date/Time   NA 141 11/15/2017 1115   K 4.2 11/15/2017 1115   CL 102 11/15/2017 1115   CO2 30 11/15/2017 1115   BUN 28 (H) 11/15/2017 1115   CREATININE 1.50 (H) 11/15/2017 1115      Component Value Date/Time   CALCIUM 9.4 11/15/2017 1115   ALKPHOS 73 11/15/2017 1115   AST 24 11/15/2017 1115   ALT 13 11/15/2017 1115   BILITOT 0.4 11/15/2017 1115     Results for Darrell Hernandez, [name] "BOB" (MRN [id number]) as of 09/13/2018 14:29  Ref. Range 11/15/2017 11:15  IgG (Immunoglobin G), Serum Latest Ref Range: 700 - 1,600 mg/dL 1,153  IgM (Immunoglobulin M), Srm Latest Ref Range: 15 - 143 mg/dL 149 (H)  IgA Latest Ref Range: 61 - 437 mg/dL 156  Kappa free light chain Latest Ref Range: 3.3 - 19.4 mg/L 66.8 (H)  Lamda free light chains Latest Ref Range: 5.7 - 26.3 mg/L 16.7  Kappa, lamda light chain ratio Latest Ref Range: 0.26 - 1.65  4.00 (H)    Impression and Plan:   83 year old gentleman with the following issues:  1.  Kappa light chain monoclonal gammopathy without any evidence of clear-cut plasma cell disorder diagnosed February 2019.  The differential diagnosis of these findings were reviewed and appears to be most likely reactive without any evidence to suggest multiple  myeloma.  Renal biopsy obtained in September 2019 showed no renal involvement.  At this time, I do not feel any further intervention is needed the likelihood of a plasma cell disorder is very low.  2. Prostate cancer: Status post definitive therapy with radiation and androgen deprivation.  I recommended active surveillance at this time he is currently receiving it under care of Dr. Diona Fanti.  3.  Renal insufficiency: Followed by Dr. Idolina Primer.  Renal biopsy did not show any specific pathology.  He will continue to follow with nephrology regarding this issue.  4. Follow-up: We will be as needed in the future.  15  minutes was spent with the patient face-to-face today.  More than 50% of time was spent on reviewing laboratory data, pathology reports, differential  diagnosis and answering questions regarding future plan of care.   Zola Button, MD 7/2/20203:29 PM

## 2018-09-14 LAB — KAPPA/LAMBDA LIGHT CHAINS
Kappa free light chain: 103.4 mg/L — ABNORMAL HIGH (ref 3.3–19.4)
Kappa, lambda light chain ratio: 4.44 — ABNORMAL HIGH (ref 0.26–1.65)
Lambda free light chains: 23.3 mg/L (ref 5.7–26.3)

## 2018-09-17 LAB — MULTIPLE MYELOMA PANEL, SERUM
Albumin SerPl Elph-Mcnc: 3.7 g/dL (ref 2.9–4.4)
Albumin/Glob SerPl: 1.3 (ref 0.7–1.7)
Alpha 1: 0.2 g/dL (ref 0.0–0.4)
Alpha2 Glob SerPl Elph-Mcnc: 0.7 g/dL (ref 0.4–1.0)
B-Globulin SerPl Elph-Mcnc: 0.8 g/dL (ref 0.7–1.3)
Gamma Glob SerPl Elph-Mcnc: 1.1 g/dL (ref 0.4–1.8)
Globulin, Total: 2.9 g/dL (ref 2.2–3.9)
IgA: 153 mg/dL (ref 61–437)
IgG (Immunoglobin G), Serum: 1234 mg/dL (ref 603–1613)
IgM (Immunoglobulin M), Srm: 158 mg/dL — ABNORMAL HIGH (ref 15–143)
Total Protein ELP: 6.6 g/dL (ref 6.0–8.5)

## 2018-11-07 DIAGNOSIS — E785 Hyperlipidemia, unspecified: Secondary | ICD-10-CM | POA: Diagnosis not present

## 2018-11-07 DIAGNOSIS — I10 Essential (primary) hypertension: Secondary | ICD-10-CM | POA: Diagnosis not present

## 2018-11-07 DIAGNOSIS — R739 Hyperglycemia, unspecified: Secondary | ICD-10-CM | POA: Diagnosis not present

## 2018-11-14 DIAGNOSIS — Z23 Encounter for immunization: Secondary | ICD-10-CM | POA: Diagnosis not present

## 2018-11-14 DIAGNOSIS — D638 Anemia in other chronic diseases classified elsewhere: Secondary | ICD-10-CM | POA: Diagnosis not present

## 2018-11-14 DIAGNOSIS — I10 Essential (primary) hypertension: Secondary | ICD-10-CM | POA: Diagnosis not present

## 2018-11-14 DIAGNOSIS — E7800 Pure hypercholesterolemia, unspecified: Secondary | ICD-10-CM | POA: Diagnosis not present

## 2018-12-19 DIAGNOSIS — Z0000 Encounter for general adult medical examination without abnormal findings: Secondary | ICD-10-CM | POA: Diagnosis not present

## 2018-12-19 DIAGNOSIS — R001 Bradycardia, unspecified: Secondary | ICD-10-CM | POA: Diagnosis not present

## 2018-12-19 DIAGNOSIS — I10 Essential (primary) hypertension: Secondary | ICD-10-CM | POA: Diagnosis not present

## 2019-02-04 DIAGNOSIS — C61 Malignant neoplasm of prostate: Secondary | ICD-10-CM | POA: Diagnosis not present

## 2019-02-15 DIAGNOSIS — C61 Malignant neoplasm of prostate: Secondary | ICD-10-CM | POA: Diagnosis not present

## 2019-03-27 DIAGNOSIS — Z961 Presence of intraocular lens: Secondary | ICD-10-CM | POA: Diagnosis not present

## 2019-03-27 DIAGNOSIS — H25812 Combined forms of age-related cataract, left eye: Secondary | ICD-10-CM | POA: Diagnosis not present

## 2019-05-09 DIAGNOSIS — C44321 Squamous cell carcinoma of skin of nose: Secondary | ICD-10-CM | POA: Diagnosis not present

## 2019-05-09 DIAGNOSIS — Z85828 Personal history of other malignant neoplasm of skin: Secondary | ICD-10-CM | POA: Diagnosis not present

## 2019-05-13 DIAGNOSIS — R001 Bradycardia, unspecified: Secondary | ICD-10-CM | POA: Diagnosis not present

## 2019-05-13 DIAGNOSIS — I10 Essential (primary) hypertension: Secondary | ICD-10-CM | POA: Diagnosis not present

## 2019-05-13 DIAGNOSIS — Z125 Encounter for screening for malignant neoplasm of prostate: Secondary | ICD-10-CM | POA: Diagnosis not present

## 2019-05-28 DIAGNOSIS — H25812 Combined forms of age-related cataract, left eye: Secondary | ICD-10-CM | POA: Diagnosis not present

## 2019-05-28 DIAGNOSIS — H35371 Puckering of macula, right eye: Secondary | ICD-10-CM | POA: Diagnosis not present

## 2019-05-28 DIAGNOSIS — L814 Other melanin hyperpigmentation: Secondary | ICD-10-CM | POA: Diagnosis not present

## 2019-05-28 DIAGNOSIS — C44329 Squamous cell carcinoma of skin of other parts of face: Secondary | ICD-10-CM | POA: Diagnosis not present

## 2019-05-28 DIAGNOSIS — Z85828 Personal history of other malignant neoplasm of skin: Secondary | ICD-10-CM | POA: Diagnosis not present

## 2019-05-28 DIAGNOSIS — C44529 Squamous cell carcinoma of skin of other part of trunk: Secondary | ICD-10-CM | POA: Diagnosis not present

## 2019-05-28 DIAGNOSIS — D485 Neoplasm of uncertain behavior of skin: Secondary | ICD-10-CM | POA: Diagnosis not present

## 2019-05-28 DIAGNOSIS — Z961 Presence of intraocular lens: Secondary | ICD-10-CM | POA: Diagnosis not present

## 2019-05-28 DIAGNOSIS — L570 Actinic keratosis: Secondary | ICD-10-CM | POA: Diagnosis not present

## 2019-05-28 DIAGNOSIS — L821 Other seborrheic keratosis: Secondary | ICD-10-CM | POA: Diagnosis not present

## 2019-05-28 DIAGNOSIS — C44729 Squamous cell carcinoma of skin of left lower limb, including hip: Secondary | ICD-10-CM | POA: Diagnosis not present

## 2019-06-06 DIAGNOSIS — C61 Malignant neoplasm of prostate: Secondary | ICD-10-CM | POA: Diagnosis not present

## 2019-06-06 DIAGNOSIS — N1831 Chronic kidney disease, stage 3a: Secondary | ICD-10-CM | POA: Diagnosis not present

## 2019-06-06 DIAGNOSIS — R001 Bradycardia, unspecified: Secondary | ICD-10-CM | POA: Diagnosis not present

## 2019-06-06 DIAGNOSIS — E785 Hyperlipidemia, unspecified: Secondary | ICD-10-CM | POA: Diagnosis not present

## 2019-06-06 DIAGNOSIS — I10 Essential (primary) hypertension: Secondary | ICD-10-CM | POA: Diagnosis not present

## 2019-06-06 DIAGNOSIS — D638 Anemia in other chronic diseases classified elsewhere: Secondary | ICD-10-CM | POA: Diagnosis not present

## 2019-06-17 DIAGNOSIS — Z85828 Personal history of other malignant neoplasm of skin: Secondary | ICD-10-CM | POA: Diagnosis not present

## 2019-06-17 DIAGNOSIS — C44329 Squamous cell carcinoma of skin of other parts of face: Secondary | ICD-10-CM | POA: Diagnosis not present

## 2019-08-21 DIAGNOSIS — C61 Malignant neoplasm of prostate: Secondary | ICD-10-CM | POA: Diagnosis not present

## 2019-09-25 DIAGNOSIS — H2512 Age-related nuclear cataract, left eye: Secondary | ICD-10-CM | POA: Diagnosis not present

## 2019-09-25 DIAGNOSIS — Z961 Presence of intraocular lens: Secondary | ICD-10-CM | POA: Diagnosis not present

## 2019-09-25 DIAGNOSIS — H35373 Puckering of macula, bilateral: Secondary | ICD-10-CM | POA: Diagnosis not present

## 2019-10-22 DIAGNOSIS — F419 Anxiety disorder, unspecified: Secondary | ICD-10-CM | POA: Diagnosis not present

## 2019-10-22 DIAGNOSIS — H66003 Acute suppurative otitis media without spontaneous rupture of ear drum, bilateral: Secondary | ICD-10-CM | POA: Diagnosis not present

## 2019-10-22 DIAGNOSIS — J029 Acute pharyngitis, unspecified: Secondary | ICD-10-CM | POA: Diagnosis not present

## 2019-11-19 DIAGNOSIS — Z23 Encounter for immunization: Secondary | ICD-10-CM | POA: Diagnosis not present

## 2019-12-02 DIAGNOSIS — E785 Hyperlipidemia, unspecified: Secondary | ICD-10-CM | POA: Diagnosis not present

## 2019-12-02 DIAGNOSIS — Z79899 Other long term (current) drug therapy: Secondary | ICD-10-CM | POA: Diagnosis not present

## 2019-12-02 DIAGNOSIS — I10 Essential (primary) hypertension: Secondary | ICD-10-CM | POA: Diagnosis not present

## 2019-12-03 DIAGNOSIS — L570 Actinic keratosis: Secondary | ICD-10-CM | POA: Diagnosis not present

## 2019-12-03 DIAGNOSIS — Z85828 Personal history of other malignant neoplasm of skin: Secondary | ICD-10-CM | POA: Diagnosis not present

## 2019-12-03 DIAGNOSIS — L814 Other melanin hyperpigmentation: Secondary | ICD-10-CM | POA: Diagnosis not present

## 2019-12-03 DIAGNOSIS — L821 Other seborrheic keratosis: Secondary | ICD-10-CM | POA: Diagnosis not present

## 2019-12-03 DIAGNOSIS — C44722 Squamous cell carcinoma of skin of right lower limb, including hip: Secondary | ICD-10-CM | POA: Diagnosis not present

## 2019-12-03 DIAGNOSIS — C44622 Squamous cell carcinoma of skin of right upper limb, including shoulder: Secondary | ICD-10-CM | POA: Diagnosis not present

## 2019-12-03 DIAGNOSIS — D0439 Carcinoma in situ of skin of other parts of face: Secondary | ICD-10-CM | POA: Diagnosis not present

## 2019-12-06 DIAGNOSIS — Z23 Encounter for immunization: Secondary | ICD-10-CM | POA: Diagnosis not present

## 2019-12-09 DIAGNOSIS — I10 Essential (primary) hypertension: Secondary | ICD-10-CM | POA: Diagnosis not present

## 2019-12-09 DIAGNOSIS — C61 Malignant neoplasm of prostate: Secondary | ICD-10-CM | POA: Diagnosis not present

## 2019-12-09 DIAGNOSIS — E785 Hyperlipidemia, unspecified: Secondary | ICD-10-CM | POA: Diagnosis not present

## 2019-12-09 DIAGNOSIS — D638 Anemia in other chronic diseases classified elsewhere: Secondary | ICD-10-CM | POA: Diagnosis not present

## 2019-12-09 DIAGNOSIS — F419 Anxiety disorder, unspecified: Secondary | ICD-10-CM | POA: Diagnosis not present

## 2019-12-09 DIAGNOSIS — R001 Bradycardia, unspecified: Secondary | ICD-10-CM | POA: Diagnosis not present

## 2019-12-31 DIAGNOSIS — Z85828 Personal history of other malignant neoplasm of skin: Secondary | ICD-10-CM | POA: Diagnosis not present

## 2019-12-31 DIAGNOSIS — C44321 Squamous cell carcinoma of skin of nose: Secondary | ICD-10-CM | POA: Diagnosis not present

## 2020-02-19 DIAGNOSIS — C61 Malignant neoplasm of prostate: Secondary | ICD-10-CM | POA: Diagnosis not present

## 2020-02-26 DIAGNOSIS — N3281 Overactive bladder: Secondary | ICD-10-CM | POA: Diagnosis not present

## 2020-02-26 DIAGNOSIS — Z8546 Personal history of malignant neoplasm of prostate: Secondary | ICD-10-CM | POA: Diagnosis not present

## 2020-03-24 DIAGNOSIS — Z23 Encounter for immunization: Secondary | ICD-10-CM | POA: Diagnosis not present

## 2020-03-25 DIAGNOSIS — L570 Actinic keratosis: Secondary | ICD-10-CM | POA: Diagnosis not present

## 2020-03-25 DIAGNOSIS — L821 Other seborrheic keratosis: Secondary | ICD-10-CM | POA: Diagnosis not present

## 2020-03-25 DIAGNOSIS — Z85828 Personal history of other malignant neoplasm of skin: Secondary | ICD-10-CM | POA: Diagnosis not present

## 2020-03-25 DIAGNOSIS — C44722 Squamous cell carcinoma of skin of right lower limb, including hip: Secondary | ICD-10-CM | POA: Diagnosis not present

## 2020-03-25 DIAGNOSIS — L814 Other melanin hyperpigmentation: Secondary | ICD-10-CM | POA: Diagnosis not present

## 2020-03-25 DIAGNOSIS — D485 Neoplasm of uncertain behavior of skin: Secondary | ICD-10-CM | POA: Diagnosis not present

## 2020-04-07 DIAGNOSIS — E785 Hyperlipidemia, unspecified: Secondary | ICD-10-CM | POA: Diagnosis not present

## 2020-04-07 DIAGNOSIS — E7800 Pure hypercholesterolemia, unspecified: Secondary | ICD-10-CM | POA: Diagnosis not present

## 2020-04-07 DIAGNOSIS — I10 Essential (primary) hypertension: Secondary | ICD-10-CM | POA: Diagnosis not present

## 2020-04-16 DIAGNOSIS — F419 Anxiety disorder, unspecified: Secondary | ICD-10-CM | POA: Diagnosis not present

## 2020-04-16 DIAGNOSIS — N183 Chronic kidney disease, stage 3 unspecified: Secondary | ICD-10-CM | POA: Diagnosis not present

## 2020-04-16 DIAGNOSIS — I10 Essential (primary) hypertension: Secondary | ICD-10-CM | POA: Diagnosis not present

## 2020-04-16 DIAGNOSIS — E7800 Pure hypercholesterolemia, unspecified: Secondary | ICD-10-CM | POA: Diagnosis not present

## 2020-05-12 DIAGNOSIS — H2512 Age-related nuclear cataract, left eye: Secondary | ICD-10-CM | POA: Diagnosis not present

## 2020-05-12 DIAGNOSIS — Z961 Presence of intraocular lens: Secondary | ICD-10-CM | POA: Diagnosis not present

## 2020-05-12 DIAGNOSIS — H35371 Puckering of macula, right eye: Secondary | ICD-10-CM | POA: Diagnosis not present

## 2020-07-08 DIAGNOSIS — L821 Other seborrheic keratosis: Secondary | ICD-10-CM | POA: Diagnosis not present

## 2020-07-08 DIAGNOSIS — L570 Actinic keratosis: Secondary | ICD-10-CM | POA: Diagnosis not present

## 2020-07-08 DIAGNOSIS — L814 Other melanin hyperpigmentation: Secondary | ICD-10-CM | POA: Diagnosis not present

## 2020-07-08 DIAGNOSIS — Z85828 Personal history of other malignant neoplasm of skin: Secondary | ICD-10-CM | POA: Diagnosis not present

## 2020-09-01 DIAGNOSIS — C61 Malignant neoplasm of prostate: Secondary | ICD-10-CM | POA: Diagnosis not present

## 2020-09-07 DIAGNOSIS — N3941 Urge incontinence: Secondary | ICD-10-CM | POA: Diagnosis not present

## 2020-09-07 DIAGNOSIS — N3281 Overactive bladder: Secondary | ICD-10-CM | POA: Diagnosis not present

## 2020-09-07 DIAGNOSIS — Z8546 Personal history of malignant neoplasm of prostate: Secondary | ICD-10-CM | POA: Diagnosis not present

## 2020-10-07 DIAGNOSIS — D2261 Melanocytic nevi of right upper limb, including shoulder: Secondary | ICD-10-CM | POA: Diagnosis not present

## 2020-10-07 DIAGNOSIS — L570 Actinic keratosis: Secondary | ICD-10-CM | POA: Diagnosis not present

## 2020-10-07 DIAGNOSIS — L814 Other melanin hyperpigmentation: Secondary | ICD-10-CM | POA: Diagnosis not present

## 2020-10-07 DIAGNOSIS — L821 Other seborrheic keratosis: Secondary | ICD-10-CM | POA: Diagnosis not present

## 2020-10-07 DIAGNOSIS — Z85828 Personal history of other malignant neoplasm of skin: Secondary | ICD-10-CM | POA: Diagnosis not present

## 2020-11-11 DIAGNOSIS — H35373 Puckering of macula, bilateral: Secondary | ICD-10-CM | POA: Diagnosis not present

## 2020-11-11 DIAGNOSIS — H2512 Age-related nuclear cataract, left eye: Secondary | ICD-10-CM | POA: Diagnosis not present

## 2020-11-11 DIAGNOSIS — Z961 Presence of intraocular lens: Secondary | ICD-10-CM | POA: Diagnosis not present

## 2020-12-18 DIAGNOSIS — Z23 Encounter for immunization: Secondary | ICD-10-CM | POA: Diagnosis not present

## 2020-12-23 DIAGNOSIS — I10 Essential (primary) hypertension: Secondary | ICD-10-CM | POA: Diagnosis not present

## 2020-12-23 DIAGNOSIS — D649 Anemia, unspecified: Secondary | ICD-10-CM | POA: Diagnosis not present

## 2020-12-23 DIAGNOSIS — E7800 Pure hypercholesterolemia, unspecified: Secondary | ICD-10-CM | POA: Diagnosis not present

## 2020-12-30 DIAGNOSIS — I10 Essential (primary) hypertension: Secondary | ICD-10-CM | POA: Diagnosis not present

## 2020-12-30 DIAGNOSIS — Z0000 Encounter for general adult medical examination without abnormal findings: Secondary | ICD-10-CM | POA: Diagnosis not present

## 2020-12-30 DIAGNOSIS — Z8546 Personal history of malignant neoplasm of prostate: Secondary | ICD-10-CM | POA: Diagnosis not present

## 2020-12-30 DIAGNOSIS — Z125 Encounter for screening for malignant neoplasm of prostate: Secondary | ICD-10-CM | POA: Diagnosis not present

## 2020-12-30 DIAGNOSIS — N1832 Chronic kidney disease, stage 3b: Secondary | ICD-10-CM | POA: Diagnosis not present

## 2020-12-30 DIAGNOSIS — N051 Unspecified nephritic syndrome with focal and segmental glomerular lesions: Secondary | ICD-10-CM | POA: Diagnosis not present

## 2020-12-30 DIAGNOSIS — D631 Anemia in chronic kidney disease: Secondary | ICD-10-CM | POA: Diagnosis not present

## 2020-12-30 DIAGNOSIS — Z91010 Allergy to peanuts: Secondary | ICD-10-CM | POA: Diagnosis not present

## 2020-12-30 DIAGNOSIS — F419 Anxiety disorder, unspecified: Secondary | ICD-10-CM | POA: Diagnosis not present

## 2020-12-30 DIAGNOSIS — R809 Proteinuria, unspecified: Secondary | ICD-10-CM | POA: Diagnosis not present

## 2020-12-30 DIAGNOSIS — Z923 Personal history of irradiation: Secondary | ICD-10-CM | POA: Diagnosis not present

## 2020-12-31 ENCOUNTER — Other Ambulatory Visit: Payer: Self-pay | Admitting: Internal Medicine

## 2020-12-31 DIAGNOSIS — N1832 Chronic kidney disease, stage 3b: Secondary | ICD-10-CM

## 2021-01-04 DIAGNOSIS — H2512 Age-related nuclear cataract, left eye: Secondary | ICD-10-CM | POA: Diagnosis not present

## 2021-01-11 DIAGNOSIS — D692 Other nonthrombocytopenic purpura: Secondary | ICD-10-CM | POA: Diagnosis not present

## 2021-01-11 DIAGNOSIS — L821 Other seborrheic keratosis: Secondary | ICD-10-CM | POA: Diagnosis not present

## 2021-01-11 DIAGNOSIS — Q828 Other specified congenital malformations of skin: Secondary | ICD-10-CM | POA: Diagnosis not present

## 2021-01-11 DIAGNOSIS — D485 Neoplasm of uncertain behavior of skin: Secondary | ICD-10-CM | POA: Diagnosis not present

## 2021-01-11 DIAGNOSIS — L570 Actinic keratosis: Secondary | ICD-10-CM | POA: Diagnosis not present

## 2021-01-11 DIAGNOSIS — Z85828 Personal history of other malignant neoplasm of skin: Secondary | ICD-10-CM | POA: Diagnosis not present

## 2021-01-11 DIAGNOSIS — L812 Freckles: Secondary | ICD-10-CM | POA: Diagnosis not present

## 2021-01-12 ENCOUNTER — Ambulatory Visit
Admission: RE | Admit: 2021-01-12 | Discharge: 2021-01-12 | Disposition: A | Discharge status: Home / Self Care | Payer: No Typology Code available for payment source | Source: Ambulatory Visit | Attending: Internal Medicine | Admitting: Internal Medicine

## 2021-01-12 DIAGNOSIS — N1832 Chronic kidney disease, stage 3b: Secondary | ICD-10-CM

## 2021-01-12 DIAGNOSIS — I10 Essential (primary) hypertension: Secondary | ICD-10-CM | POA: Diagnosis not present

## 2021-01-12 DIAGNOSIS — E785 Hyperlipidemia, unspecified: Secondary | ICD-10-CM | POA: Diagnosis not present

## 2021-01-14 DIAGNOSIS — H52202 Unspecified astigmatism, left eye: Secondary | ICD-10-CM | POA: Diagnosis not present

## 2021-01-14 DIAGNOSIS — H25812 Combined forms of age-related cataract, left eye: Secondary | ICD-10-CM | POA: Diagnosis not present

## 2021-01-14 DIAGNOSIS — Z961 Presence of intraocular lens: Secondary | ICD-10-CM | POA: Diagnosis not present

## 2021-01-14 DIAGNOSIS — I10 Essential (primary) hypertension: Secondary | ICD-10-CM | POA: Diagnosis not present

## 2021-01-28 DIAGNOSIS — I2510 Atherosclerotic heart disease of native coronary artery without angina pectoris: Secondary | ICD-10-CM | POA: Diagnosis not present

## 2021-01-28 DIAGNOSIS — E7800 Pure hypercholesterolemia, unspecified: Secondary | ICD-10-CM | POA: Diagnosis not present

## 2021-01-28 DIAGNOSIS — N1832 Chronic kidney disease, stage 3b: Secondary | ICD-10-CM | POA: Diagnosis not present

## 2021-01-28 DIAGNOSIS — N051 Unspecified nephritic syndrome with focal and segmental glomerular lesions: Secondary | ICD-10-CM | POA: Diagnosis not present

## 2021-01-28 DIAGNOSIS — I10 Essential (primary) hypertension: Secondary | ICD-10-CM | POA: Diagnosis not present

## 2021-01-29 ENCOUNTER — Other Ambulatory Visit: Payer: Medicare Other

## 2021-02-28 DIAGNOSIS — R931 Abnormal findings on diagnostic imaging of heart and coronary circulation: Secondary | ICD-10-CM | POA: Insufficient documentation

## 2021-02-28 NOTE — Progress Notes (Signed)
Cardiology Office Note   Date:  03/02/2021   ID:  Darrell Spanner., DOB 1935/11/14, MRN [id number]  PCP:  Deland Pretty, MD  Cardiologist:   Minus Breeding, MD Referring:  Deland Pretty, MD  Chief Complaint  Patient presents with   Elevated Coronary Calcium      History of Present Illness: Darrell Hernandez. is a 85 y.o. male who presents for elevated coronary calcium.  The coronary calcium score was 3333.  This was done because of cardiovascular risk factors.  The patient actually has had no past cardiac history other than a treadmill test he thinks decades ago.  He does have hypertension and dyslipidemia.  However, he is very active.  He feels well. The patient denies any new symptoms such as chest discomfort, neck or arm discomfort. There has been no new shortness of breath, PND or orthopnea. There have been no reported palpitations, presyncope or syncope.  He works at [employer] walking on the elliptical.  He takes 8 flights of stairs intentionally.  He walks up a hill repeatedly intentionally.     Past Medical History:  Diagnosis Date   Dyslipidemia    GERD (gastroesophageal reflux disease)    History of idiopathic seizure    per pt age 41 or 15 had 2 seizure's , unknown cause, and no seizure's since   Hypertension    Prostate cancer ([place]) UROLOGIST-  DR DAHLSTEDT/  ONCOLOGIST-  DR MANNING/ DR Alen Blew   dx 08-10-2016 (bx)  Stage T2a,  Gleason 4+5,  PSA 6.1,  vol 38.85cc--- plan external beam radiation    Past Surgical History:  Procedure Laterality Date   CATARACT EXTRACTION W/ INTRAOCULAR LENS IMPLANT Right 2015   COLONOSCOPY  last one 2017   EYE SURGERY Right 2010   removal epiretinal membrane   GOLD SEED IMPLANT N/A 12/09/2016   Procedure: GOLD SEED IMPLANT;  Surgeon: Franchot Gallo, MD;  Location: [facility];  Service: Urology;  Laterality: N/A;   INGUINAL HERNIA REPAIR Bilateral 1980s;  1990   SPACE OAR INSTILLATION N/A 12/09/2016   Procedure:  SPACE OAR INSTILLATION;  Surgeon: Franchot Gallo, MD;  Location: [facility];  Service: Urology;  Laterality: N/A;   TONSILLECTOMY  child     Current Outpatient Medications  Medication Sig Dispense Refill   amLODipine (NORVASC) 10 MG tablet Take 5 mg by mouth at bedtime.      cyanocobalamin 1000 MCG tablet Take by mouth.     dapagliflozin propanediol (FARXIGA) 5 MG TABS tablet 1 tablet     famotidine (PEPCID) 10 MG tablet 1 tablet as needed     Ferrous Sulfate (IRON) 28 MG TABS Take 28 mg by mouth once a week.     folic acid (FOLVITE) 106 MCG tablet 1 tablet     fosinopril (MONOPRIL) 40 MG tablet Take 40 mg by mouth in the morning and at bedtime. Take 40 mg in the morning and 40 mg in the evening     hydrochlorothiazide (MICROZIDE) 12.5 MG capsule Take 12.5 mg by mouth daily.     LORazepam (ATIVAN) 1 MG tablet Take 0.5 mg by mouth at bedtime as needed for anxiety or sleep.      metoprolol tartrate (LOPRESSOR) 25 MG tablet Take 12.5 mg by mouth every evening. Pt states he takes half a tablet daily     MINOXIDIL EX Apply 1 application topically 2 (two) times daily.     Multiple Vitamin (MULTIVITAMIN WITH MINERALS)  TABS tablet Take 1 tablet by mouth 2 (two) times a week.     PARoxetine (PAXIL) 20 MG tablet Take 20 mg by mouth every evening.     ranitidine (ZANTAC) 150 MG tablet Take 150 mg by mouth daily as needed for heartburn.     rosuvastatin (CRESTOR) 40 MG tablet 1 tablet     tamsulosin (FLOMAX) 0.4 MG CAPS capsule Take 1 capsule (0.4 mg total) by mouth daily after supper. (Patient taking differently: Take 0.4 mg by mouth daily as needed (urinary flow). In the evening) 30 capsule 5   tolterodine (DETROL LA) 4 MG 24 hr capsule Take 4 mg by mouth daily.     Turmeric 500 MG CAPS See admin instructions.     Zinc 15 MG CAPS Take 15 mg by mouth daily.     No current facility-administered medications for this visit.    Allergies:   Other    Social History:  The patient   reports that he has never smoked. He has never used smokeless tobacco. He reports current alcohol use of about 2.0 standard drinks per week. He reports that he does not use drugs.   Family History:  The patient's family history includes Pulmonary embolism in his father.    ROS:  Please see the history of present illness.   Otherwise, review of systems are positive for none.   All other systems are reviewed and negative.    PHYSICAL EXAM: VS:  BP (!) 160/81    Pulse 61    Ht 5\' 6"  (1.676 m)    Wt 148 lb 12.8 oz (67.5 kg)    SpO2 99%    BMI 24.02 kg/m  , BMI Body mass index is 24.02 kg/m. GENERAL:  Well appearing HEENT:  Pupils equal round and reactive, fundi not visualized, oral mucosa unremarkable NECK:  No jugular venous distention, waveform within normal limits, carotid upstroke brisk and symmetric, no bruits, no thyromegaly LYMPHATICS:  No cervical, inguinal adenopathy LUNGS:  Clear to auscultation bilaterally BACK:  No CVA tenderness CHEST:  Unremarkable HEART:  PMI not displaced or sustained,S1 and S2 within normal limits, no S3, no S4, no clicks, no rubs, soft apical systolic murmur, no diastolic murmurs ABD:  Flat, positive bowel sounds normal in frequency in pitch, no bruits, no rebound, no guarding, no midline pulsatile mass, no hepatomegaly, no splenomegaly EXT:  2 plus pulses throughout, bilateral leg edema, no cyanosis no clubbing SKIN:  No rashes no nodules NEURO:  Cranial nerves II through XII grossly intact, motor grossly intact throughout PSYCH:  Cognitively intact, oriented to person place and time    EKG:  EKG is ordered today. The ekg ordered today demonstrates sinus bradycardia, rate 61, left axis deviation, left anterior fascicular block, poor anterior R wave progression, no acute ST-T wave changes.   Recent Labs: No results found for requested labs within last 8760 hours.    Lipid Panel No results found for: CHOL, TRIG, HDL, CHOLHDL, VLDL, LDLCALC,  LDLDIRECT    Wt Readings from Last 3 Encounters:  03/02/21 148 lb 12.8 oz (67.5 kg)  09/13/18 138 lb 8 oz (62.8 kg)  11/30/17 140 lb (63.5 kg)      Other studies Reviewed: Additional studies/ records that were reviewed today include: Labs and primary care office notes. . Review of the above records demonstrates:  Please see elsewhere in the note.     ASSESSMENT AND PLAN:   ELEVATED CALCIUM: The patient has a very active lifestyle.  He has no high risk symptoms.  He does have an elevated coronary calcium although we do not typically order this at this age.  I had a long conversation with him about this.  I think the best plan would be to screen him with a modified Bruce protocol treadmill and only pursue further imaging if he has high findings on the treadmill.  He does not have high risk symptoms.  DYSLIPIDEMIA: He has been started on Crestor and I agree with aggressive management given his calcium  HTN: He has manipulated his own meds recently and I have asked him to keep a blood pressure diary as he currently in the office is not at goal therapy.   Current medicines are reviewed at length with the patient today.  The patient does not have concerns regarding medicines.  The following changes have been made:  no change  Labs/ tests ordered today include:   Orders Placed This Encounter  Procedures   EXERCISE TOLERANCE TEST (ETT)   EKG 12-Lead     Disposition:   FU with me as needed based on the results of the above testing.   Signed, Minus Breeding, MD  03/02/2021 5:30 PM    [place]

## 2021-03-02 ENCOUNTER — Encounter: Payer: Self-pay | Admitting: Cardiology

## 2021-03-02 ENCOUNTER — Other Ambulatory Visit: Payer: Self-pay

## 2021-03-02 ENCOUNTER — Ambulatory Visit (INDEPENDENT_AMBULATORY_CARE_PROVIDER_SITE_OTHER): Payer: Medicare Other | Admitting: Cardiology

## 2021-03-02 VITALS — BP 160/81 | HR 61 | Ht 66.0 in | Wt 148.8 lb

## 2021-03-02 DIAGNOSIS — I2510 Atherosclerotic heart disease of native coronary artery without angina pectoris: Secondary | ICD-10-CM | POA: Diagnosis not present

## 2021-03-02 DIAGNOSIS — R931 Abnormal findings on diagnostic imaging of heart and coronary circulation: Secondary | ICD-10-CM

## 2021-03-02 NOTE — Patient Instructions (Addendum)
Medication Instructions:  Your Physician recommend you continue on your current medication as directed.    *If you need a refill on your cardiac medications before your next appointment, please call your pharmacy*   Testing/Procedures:   [facility] at [facility] [address] [place], [place] [address] Phone:  [phone]   You are scheduled for an Exercise Stress Test (modified Bruce) on    at     Please arrive 15 minutes prior to your appointment time for registration and insurance purposes.  The test will take approximately 45 minutes to complete.  How to prepare for your Exercise Stress Test: Do bring a list of your current medications with you.  If not listed below, you may take your medications as normal. Do not take metoprolol (Lopressor, Toprol) for 24 hours prior to the test.  Bring the medication to your appointment as you may be required to take it once the test is complete. Do wear comfortable clothes (no dresses or overalls) and walking shoes, tennis shoes preferred (no heels or open toed shoes are allowed) Do Not wear cologne, perfume, aftershave or lotions (deodorant is allowed). Please report to [place], [address] for your test.  If these instructions are not followed, your test will have to be rescheduled.  If you have questions or concerns about your appointment, you can call the Stress Lab at [phone].  If you cannot keep your appointment, please provide 24 hours notification to the Stress Lab, to avoid a possible $50 charge to your account    Follow-Up: At [facility], you and your health needs are our priority.  As part of our continuing mission to provide you with exceptional heart care, we have created designated Provider Care Teams.  These Care Teams include your primary Cardiologist (physician) and Advanced Practice Providers (APPs -  Physician Assistants and Nurse Practitioners) who all  work together to provide you with the care you need, when you need it.  We recommend signing up for the patient portal called "MyChart".  Sign up information is provided on this After Visit Summary.  MyChart is used to connect with patients for Virtual Visits (Telemedicine).  Patients are able to view lab/test results, encounter notes, upcoming appointments, etc.  Non-urgent messages can be sent to your provider as well.   To learn more about what you can do with MyChart, go to [email or web address].    Your next appointment:    As needed.  The format for your next appointment:   In Person  Provider:   Minus Breeding, MD

## 2021-03-05 DIAGNOSIS — Z20822 Contact with and (suspected) exposure to covid-19: Secondary | ICD-10-CM | POA: Diagnosis not present

## 2021-03-05 DIAGNOSIS — J029 Acute pharyngitis, unspecified: Secondary | ICD-10-CM | POA: Diagnosis not present

## 2021-03-05 DIAGNOSIS — R197 Diarrhea, unspecified: Secondary | ICD-10-CM | POA: Diagnosis not present

## 2021-03-18 DIAGNOSIS — E7800 Pure hypercholesterolemia, unspecified: Secondary | ICD-10-CM | POA: Diagnosis not present

## 2021-03-23 DIAGNOSIS — C61 Malignant neoplasm of prostate: Secondary | ICD-10-CM | POA: Diagnosis not present

## 2021-03-24 ENCOUNTER — Telehealth (HOSPITAL_COMMUNITY): Payer: Self-pay | Admitting: *Deleted

## 2021-03-24 NOTE — Telephone Encounter (Signed)
Close encounter 

## 2021-03-25 ENCOUNTER — Other Ambulatory Visit: Payer: Self-pay

## 2021-03-25 ENCOUNTER — Ambulatory Visit (HOSPITAL_COMMUNITY)
Admission: RE | Admit: 2021-03-25 | Discharge: 2021-03-25 | Disposition: A | Discharge status: Home / Self Care | Payer: Medicare Other | Source: Ambulatory Visit | Attending: Cardiology | Admitting: Cardiology

## 2021-03-25 DIAGNOSIS — R931 Abnormal findings on diagnostic imaging of heart and coronary circulation: Secondary | ICD-10-CM | POA: Insufficient documentation

## 2021-03-26 LAB — EXERCISE TOLERANCE TEST
Angina Index: 0
Duke Treadmill Score: 12
Estimated workload: 7
Exercise duration (min): 12 min
Exercise duration (sec): 0 s
MPHR: 137 {beats}/min
Peak HR: 118 {beats}/min
Percent HR: 86 %
Rest HR: 60 {beats}/min
ST Depression (mm): 0 mm

## 2021-04-01 ENCOUNTER — Encounter: Payer: Self-pay | Admitting: *Deleted

## 2021-04-08 DIAGNOSIS — I10 Essential (primary) hypertension: Secondary | ICD-10-CM | POA: Diagnosis not present

## 2021-04-13 DIAGNOSIS — C44722 Squamous cell carcinoma of skin of right lower limb, including hip: Secondary | ICD-10-CM | POA: Diagnosis not present

## 2021-04-13 DIAGNOSIS — Z85828 Personal history of other malignant neoplasm of skin: Secondary | ICD-10-CM | POA: Diagnosis not present

## 2021-04-13 DIAGNOSIS — D692 Other nonthrombocytopenic purpura: Secondary | ICD-10-CM | POA: Diagnosis not present

## 2021-04-13 DIAGNOSIS — D489 Neoplasm of uncertain behavior, unspecified: Secondary | ICD-10-CM | POA: Diagnosis not present

## 2021-04-13 DIAGNOSIS — L570 Actinic keratosis: Secondary | ICD-10-CM | POA: Diagnosis not present

## 2021-04-13 DIAGNOSIS — C44321 Squamous cell carcinoma of skin of nose: Secondary | ICD-10-CM | POA: Diagnosis not present

## 2021-04-13 DIAGNOSIS — L821 Other seborrheic keratosis: Secondary | ICD-10-CM | POA: Diagnosis not present

## 2021-04-13 DIAGNOSIS — L817 Pigmented purpuric dermatosis: Secondary | ICD-10-CM | POA: Diagnosis not present

## 2021-04-22 DIAGNOSIS — E871 Hypo-osmolality and hyponatremia: Secondary | ICD-10-CM | POA: Diagnosis not present

## 2021-04-23 DIAGNOSIS — E875 Hyperkalemia: Secondary | ICD-10-CM | POA: Diagnosis not present

## 2021-04-23 DIAGNOSIS — I10 Essential (primary) hypertension: Secondary | ICD-10-CM | POA: Diagnosis not present

## 2021-05-07 DIAGNOSIS — I10 Essential (primary) hypertension: Secondary | ICD-10-CM | POA: Diagnosis not present

## 2021-05-18 DIAGNOSIS — Z961 Presence of intraocular lens: Secondary | ICD-10-CM | POA: Diagnosis not present

## 2021-05-18 DIAGNOSIS — H35371 Puckering of macula, right eye: Secondary | ICD-10-CM | POA: Diagnosis not present

## 2021-05-18 DIAGNOSIS — H34831 Tributary (branch) retinal vein occlusion, right eye, with macular edema: Secondary | ICD-10-CM | POA: Diagnosis not present

## 2021-05-20 DIAGNOSIS — C44321 Squamous cell carcinoma of skin of nose: Secondary | ICD-10-CM | POA: Diagnosis not present

## 2021-05-20 DIAGNOSIS — Z85828 Personal history of other malignant neoplasm of skin: Secondary | ICD-10-CM | POA: Diagnosis not present

## 2021-05-26 DIAGNOSIS — Z961 Presence of intraocular lens: Secondary | ICD-10-CM | POA: Diagnosis not present

## 2021-05-26 DIAGNOSIS — H26492 Other secondary cataract, left eye: Secondary | ICD-10-CM | POA: Diagnosis not present

## 2021-05-26 DIAGNOSIS — Z9842 Cataract extraction status, left eye: Secondary | ICD-10-CM | POA: Diagnosis not present

## 2021-05-26 DIAGNOSIS — H35373 Puckering of macula, bilateral: Secondary | ICD-10-CM | POA: Diagnosis not present

## 2021-06-15 DIAGNOSIS — H34831 Tributary (branch) retinal vein occlusion, right eye, with macular edema: Secondary | ICD-10-CM | POA: Diagnosis not present

## 2021-06-28 DIAGNOSIS — L821 Other seborrheic keratosis: Secondary | ICD-10-CM | POA: Diagnosis not present

## 2021-06-28 DIAGNOSIS — L570 Actinic keratosis: Secondary | ICD-10-CM | POA: Diagnosis not present

## 2021-06-28 DIAGNOSIS — C44722 Squamous cell carcinoma of skin of right lower limb, including hip: Secondary | ICD-10-CM | POA: Diagnosis not present

## 2021-06-28 DIAGNOSIS — D485 Neoplasm of uncertain behavior of skin: Secondary | ICD-10-CM | POA: Diagnosis not present

## 2021-06-28 DIAGNOSIS — Z85828 Personal history of other malignant neoplasm of skin: Secondary | ICD-10-CM | POA: Diagnosis not present

## 2021-06-28 DIAGNOSIS — L814 Other melanin hyperpigmentation: Secondary | ICD-10-CM | POA: Diagnosis not present

## 2021-06-29 DIAGNOSIS — D539 Nutritional anemia, unspecified: Secondary | ICD-10-CM | POA: Diagnosis not present

## 2021-06-29 DIAGNOSIS — I10 Essential (primary) hypertension: Secondary | ICD-10-CM | POA: Diagnosis not present

## 2021-06-30 DIAGNOSIS — I10 Essential (primary) hypertension: Secondary | ICD-10-CM | POA: Diagnosis not present

## 2021-06-30 DIAGNOSIS — D631 Anemia in chronic kidney disease: Secondary | ICD-10-CM | POA: Diagnosis not present

## 2021-06-30 DIAGNOSIS — N189 Chronic kidney disease, unspecified: Secondary | ICD-10-CM | POA: Diagnosis not present

## 2021-07-13 DIAGNOSIS — H34831 Tributary (branch) retinal vein occlusion, right eye, with macular edema: Secondary | ICD-10-CM | POA: Diagnosis not present

## 2021-07-13 DIAGNOSIS — Z961 Presence of intraocular lens: Secondary | ICD-10-CM | POA: Diagnosis not present

## 2021-07-13 DIAGNOSIS — H35371 Puckering of macula, right eye: Secondary | ICD-10-CM | POA: Diagnosis not present

## 2021-08-27 DIAGNOSIS — H35371 Puckering of macula, right eye: Secondary | ICD-10-CM | POA: Diagnosis not present

## 2021-08-27 DIAGNOSIS — Z961 Presence of intraocular lens: Secondary | ICD-10-CM | POA: Diagnosis not present

## 2021-08-27 DIAGNOSIS — H34831 Tributary (branch) retinal vein occlusion, right eye, with macular edema: Secondary | ICD-10-CM | POA: Diagnosis not present

## 2021-09-03 DIAGNOSIS — C61 Malignant neoplasm of prostate: Secondary | ICD-10-CM | POA: Diagnosis not present

## 2021-09-10 DIAGNOSIS — N3281 Overactive bladder: Secondary | ICD-10-CM | POA: Diagnosis not present

## 2021-09-10 DIAGNOSIS — N5201 Erectile dysfunction due to arterial insufficiency: Secondary | ICD-10-CM | POA: Diagnosis not present

## 2021-09-10 DIAGNOSIS — Z8546 Personal history of malignant neoplasm of prostate: Secondary | ICD-10-CM | POA: Diagnosis not present

## 2021-09-10 DIAGNOSIS — N3941 Urge incontinence: Secondary | ICD-10-CM | POA: Diagnosis not present

## 2021-09-27 DIAGNOSIS — D045 Carcinoma in situ of skin of trunk: Secondary | ICD-10-CM | POA: Diagnosis not present

## 2021-09-27 DIAGNOSIS — L821 Other seborrheic keratosis: Secondary | ICD-10-CM | POA: Diagnosis not present

## 2021-09-27 DIAGNOSIS — L814 Other melanin hyperpigmentation: Secondary | ICD-10-CM | POA: Diagnosis not present

## 2021-09-27 DIAGNOSIS — Z85828 Personal history of other malignant neoplasm of skin: Secondary | ICD-10-CM | POA: Diagnosis not present

## 2021-09-27 DIAGNOSIS — L570 Actinic keratosis: Secondary | ICD-10-CM | POA: Diagnosis not present

## 2021-09-27 DIAGNOSIS — D485 Neoplasm of uncertain behavior of skin: Secondary | ICD-10-CM | POA: Diagnosis not present

## 2021-09-28 DIAGNOSIS — H35371 Puckering of macula, right eye: Secondary | ICD-10-CM | POA: Diagnosis not present

## 2021-09-28 DIAGNOSIS — H34831 Tributary (branch) retinal vein occlusion, right eye, with macular edema: Secondary | ICD-10-CM | POA: Diagnosis not present

## 2021-09-28 DIAGNOSIS — Z961 Presence of intraocular lens: Secondary | ICD-10-CM | POA: Diagnosis not present

## 2021-11-02 DIAGNOSIS — H34831 Tributary (branch) retinal vein occlusion, right eye, with macular edema: Secondary | ICD-10-CM | POA: Diagnosis not present

## 2021-11-02 DIAGNOSIS — H35371 Puckering of macula, right eye: Secondary | ICD-10-CM | POA: Diagnosis not present

## 2021-11-02 DIAGNOSIS — Z961 Presence of intraocular lens: Secondary | ICD-10-CM | POA: Diagnosis not present

## 2021-12-07 DIAGNOSIS — Z961 Presence of intraocular lens: Secondary | ICD-10-CM | POA: Diagnosis not present

## 2021-12-07 DIAGNOSIS — H35371 Puckering of macula, right eye: Secondary | ICD-10-CM | POA: Diagnosis not present

## 2021-12-07 DIAGNOSIS — H34831 Tributary (branch) retinal vein occlusion, right eye, with macular edema: Secondary | ICD-10-CM | POA: Diagnosis not present

## 2021-12-28 DIAGNOSIS — L570 Actinic keratosis: Secondary | ICD-10-CM | POA: Diagnosis not present

## 2021-12-28 DIAGNOSIS — L821 Other seborrheic keratosis: Secondary | ICD-10-CM | POA: Diagnosis not present

## 2021-12-28 DIAGNOSIS — L814 Other melanin hyperpigmentation: Secondary | ICD-10-CM | POA: Diagnosis not present

## 2021-12-28 DIAGNOSIS — Z85828 Personal history of other malignant neoplasm of skin: Secondary | ICD-10-CM | POA: Diagnosis not present

## 2021-12-28 DIAGNOSIS — D485 Neoplasm of uncertain behavior of skin: Secondary | ICD-10-CM | POA: Diagnosis not present

## 2021-12-30 DIAGNOSIS — D539 Nutritional anemia, unspecified: Secondary | ICD-10-CM | POA: Diagnosis not present

## 2021-12-30 DIAGNOSIS — I10 Essential (primary) hypertension: Secondary | ICD-10-CM | POA: Diagnosis not present

## 2021-12-30 DIAGNOSIS — E7800 Pure hypercholesterolemia, unspecified: Secondary | ICD-10-CM | POA: Diagnosis not present

## 2022-01-04 DIAGNOSIS — I2510 Atherosclerotic heart disease of native coronary artery without angina pectoris: Secondary | ICD-10-CM | POA: Diagnosis not present

## 2022-01-04 DIAGNOSIS — F419 Anxiety disorder, unspecified: Secondary | ICD-10-CM | POA: Diagnosis not present

## 2022-01-04 DIAGNOSIS — Z23 Encounter for immunization: Secondary | ICD-10-CM | POA: Diagnosis not present

## 2022-01-04 DIAGNOSIS — I10 Essential (primary) hypertension: Secondary | ICD-10-CM | POA: Diagnosis not present

## 2022-01-04 DIAGNOSIS — Z0000 Encounter for general adult medical examination without abnormal findings: Secondary | ICD-10-CM | POA: Diagnosis not present

## 2022-01-04 DIAGNOSIS — N1832 Chronic kidney disease, stage 3b: Secondary | ICD-10-CM | POA: Diagnosis not present

## 2022-01-04 DIAGNOSIS — D539 Nutritional anemia, unspecified: Secondary | ICD-10-CM | POA: Diagnosis not present

## 2022-01-06 ENCOUNTER — Telehealth: Payer: Self-pay | Admitting: Hematology

## 2022-01-06 NOTE — Telephone Encounter (Signed)
Scheduled appt per 10/25 referral. Pt is aware of appt date and time. Pt is aware to arrive 15 mins prior to appt time and to bring and updated insurance card. Pt is aware of appt location.   

## 2022-01-26 ENCOUNTER — Other Ambulatory Visit: Payer: Self-pay

## 2022-01-26 ENCOUNTER — Inpatient Hospital Stay: Payer: Medicare Other

## 2022-01-26 ENCOUNTER — Inpatient Hospital Stay: Payer: Medicare Other | Attending: Hematology | Admitting: Hematology

## 2022-01-26 VITALS — BP 140/71 | HR 63 | Temp 97.5°F | Resp 18 | Ht 66.0 in | Wt 139.6 lb

## 2022-01-26 DIAGNOSIS — D72819 Decreased white blood cell count, unspecified: Secondary | ICD-10-CM | POA: Diagnosis not present

## 2022-01-26 DIAGNOSIS — D649 Anemia, unspecified: Secondary | ICD-10-CM | POA: Insufficient documentation

## 2022-01-26 DIAGNOSIS — E538 Deficiency of other specified B group vitamins: Secondary | ICD-10-CM | POA: Insufficient documentation

## 2022-01-26 DIAGNOSIS — D539 Nutritional anemia, unspecified: Secondary | ICD-10-CM

## 2022-01-26 LAB — CBC WITH DIFFERENTIAL/PLATELET
Abs Immature Granulocytes: 0.01 10*3/uL (ref 0.00–0.07)
Basophils Absolute: 0 10*3/uL (ref 0.0–0.1)
Basophils Relative: 0 %
Eosinophils Absolute: 0.3 10*3/uL (ref 0.0–0.5)
Eosinophils Relative: 6 %
HCT: 34.3 % — ABNORMAL LOW (ref 39.0–52.0)
Hemoglobin: 11.5 g/dL — ABNORMAL LOW (ref 13.0–17.0)
Immature Granulocytes: 0 %
Lymphocytes Relative: 14 %
Lymphs Abs: 0.7 10*3/uL (ref 0.7–4.0)
MCH: 34 pg (ref 26.0–34.0)
MCHC: 33.5 g/dL (ref 30.0–36.0)
MCV: 101.5 fL — ABNORMAL HIGH (ref 80.0–100.0)
Monocytes Absolute: 0.8 10*3/uL (ref 0.1–1.0)
Monocytes Relative: 15 %
Neutro Abs: 3.3 10*3/uL (ref 1.7–7.7)
Neutrophils Relative %: 65 %
Platelets: 210 10*3/uL (ref 150–400)
RBC: 3.38 MIL/uL — ABNORMAL LOW (ref 4.22–5.81)
RDW: 12.2 % (ref 11.5–15.5)
WBC: 5.1 10*3/uL (ref 4.0–10.5)
nRBC: 0 % (ref 0.0–0.2)

## 2022-01-26 LAB — CMP (CANCER CENTER ONLY)
ALT: 23 U/L (ref 0–44)
AST: 28 U/L (ref 15–41)
Albumin: 4.6 g/dL (ref 3.5–5.0)
Alkaline Phosphatase: 74 U/L (ref 38–126)
Anion gap: 7 (ref 5–15)
BUN: 40 mg/dL — ABNORMAL HIGH (ref 8–23)
CO2: 27 mmol/L (ref 22–32)
Calcium: 10 mg/dL (ref 8.9–10.3)
Chloride: 105 mmol/L (ref 98–111)
Creatinine: 2.1 mg/dL — ABNORMAL HIGH (ref 0.61–1.24)
GFR, Estimated: 31 mL/min — ABNORMAL LOW (ref >60)
Glucose, Bld: 82 mg/dL (ref 70–99)
Potassium: 4.3 mmol/L (ref 3.5–5.1)
Sodium: 139 mmol/L (ref 135–145)
Total Bilirubin: 0.4 mg/dL (ref 0.3–1.2)
Total Protein: 8.4 g/dL — ABNORMAL HIGH (ref 6.5–8.1)

## 2022-01-26 LAB — VITAMIN B12: Vitamin B-12: 2216 pg/mL — ABNORMAL HIGH (ref 180–914)

## 2022-01-26 LAB — TSH: TSH: 4.32 u[IU]/mL (ref 0.350–4.500)

## 2022-01-26 LAB — LACTATE DEHYDROGENASE: LDH: 167 U/L (ref 98–192)

## 2022-01-26 NOTE — Progress Notes (Signed)
HEMATOLOGY/ONCOLOGY CONSULTATION NOTE  Date of Service: 01/26/2022  Patient Care Team: Deland Pretty, MD as PCP - General (Internal Medicine) Minus Breeding, MD as PCP - Cardiology (Cardiology)  CHIEF COMPLAINTS/PURPOSE OF CONSULTATION:  Anemia and Mild Leukopenia  HISTORY OF PRESENTING ILLNESS:  Darrell Pickup. is a wonderful 86 y.o. male who has been referred to [place] by Dr. Deland Pretty, MD, for evaluation and management of Anemia and Mild Leukopenia.  Patient notes his lab results that showed anemia and mild leukopenia was his annual lab.   He notes he has been slightly anemic previously. He denies of any health problems in the last 6 months. He denies of any medicine changes in the past 6 months.   Patient denies using any OTC pain medication.   He was previously Vitamin-B12 deficient and he started taking Vitamin-B 2000 mg supplement, but denies taking B-Complex. He started this around 4 years ago. He is also taking folic acid and   He denies thyroid problems, recent surgeries, blood disorders, fever, chills, leg swelling, abdominal pain, abnormal bowl moments, bone pain, and unexpected weight loss. He reports he has been eating well with no dietary restrictions.   Patient notes he was diagnosed with prostate cancer in 2018. He was treated with external beam radiation and hormone therapy. His last hormone therapy was around January 2020. He regularly follows up with his specialist for prostate cancer.   He denies of enlarged spleen and any liver disease. Patient notes he used to drink 5-6 alcoholic drink a week, but has cut down to 1-2 alcoholic drink a week. He denies of being an heavy drinker in the past.   He notes he has squamous and basal skin cancer, which he regularly follows up with his Dermatologist every 3 months.    MEDICAL HISTORY:  Past Medical History:  Diagnosis Date   Dyslipidemia    GERD (gastroesophageal reflux disease)    History of idiopathic  seizure    per pt age 89 or 61 had 2 seizure's , unknown cause, and no seizure's since   Hypertension    Prostate cancer ([place]) UROLOGIST-  DR DAHLSTEDT/  ONCOLOGIST-  DR MANNING/ DR Alen Blew   dx 08-10-2016 (bx)  Stage T2a,  Gleason 4+5,  PSA 6.1,  vol 38.85cc--- plan external beam radiation    SURGICAL HISTORY: Past Surgical History:  Procedure Laterality Date   CATARACT EXTRACTION W/ INTRAOCULAR LENS IMPLANT Right 2015   COLONOSCOPY  last one 2017   EYE SURGERY Right 2010   removal epiretinal membrane   GOLD SEED IMPLANT N/A 12/09/2016   Procedure: GOLD SEED IMPLANT;  Surgeon: Franchot Gallo, MD;  Location: [facility];  Service: Urology;  Laterality: N/A;   INGUINAL HERNIA REPAIR Bilateral 1980s;  1990   SPACE OAR INSTILLATION N/A 12/09/2016   Procedure: SPACE OAR INSTILLATION;  Surgeon: Franchot Gallo, MD;  Location: [facility];  Service: Urology;  Laterality: N/A;   TONSILLECTOMY  child    SOCIAL HISTORY: Social History   Socioeconomic History   Marital status: Married    Spouse name: Not on file   Number of children: Not on file   Years of education: Not on file   Highest education level: Not on file  Occupational History   Not on file  Tobacco Use   Smoking status: Never   Smokeless tobacco: Never  Vaping Use   Vaping Use: Never used  Substance and Sexual Activity   Alcohol use: Yes  Alcohol/week: 2.0 standard drinks of alcohol    Types: 2 Shots of liquor per week    Comment: OCCASIONAL   Drug use: No   Sexual activity: Not on file  Other Topics Concern   Not on file  Social History Narrative   Lives with wife.  Retired [employer].     Social Determinants of Health   Financial Resource Strain: Not on file  Food Insecurity: Not on file  Transportation Needs: Not on file  Physical Activity: Not on file  Stress: Not on file  Social Connections: Not on file  Intimate Partner Violence: Not on file    FAMILY HISTORY: Family  History  Problem Relation Age of Onset   Pulmonary embolism Father     ALLERGIES:  is allergic to other.  MEDICATIONS:  Current Outpatient Medications  Medication Sig Dispense Refill   amLODipine (NORVASC) 10 MG tablet Take 5 mg by mouth at bedtime.      cyanocobalamin 1000 MCG tablet Take by mouth.     dapagliflozin propanediol (FARXIGA) 5 MG TABS tablet 1 tablet     famotidine (PEPCID) 10 MG tablet 1 tablet as needed     Ferrous Sulfate (IRON) 28 MG TABS Take 28 mg by mouth once a week.     folic acid (FOLVITE) 024 MCG tablet 1 tablet     fosinopril (MONOPRIL) 40 MG tablet Take 40 mg by mouth in the morning and at bedtime. Take 40 mg in the morning and 40 mg in the evening     hydrochlorothiazide (MICROZIDE) 12.5 MG capsule Take 12.5 mg by mouth daily.     LORazepam (ATIVAN) 1 MG tablet Take 0.5 mg by mouth at bedtime as needed for anxiety or sleep.      metoprolol tartrate (LOPRESSOR) 25 MG tablet Take 12.5 mg by mouth every evening. Pt states he takes half a tablet daily     MINOXIDIL EX Apply 1 application topically 2 (two) times daily.     Multiple Vitamin (MULTIVITAMIN WITH MINERALS) TABS tablet Take 1 tablet by mouth 2 (two) times a week.     PARoxetine (PAXIL) 20 MG tablet Take 20 mg by mouth every evening.     ranitidine (ZANTAC) 150 MG tablet Take 150 mg by mouth daily as needed for heartburn.     rosuvastatin (CRESTOR) 40 MG tablet 1 tablet     tamsulosin (FLOMAX) 0.4 MG CAPS capsule Take 1 capsule (0.4 mg total) by mouth daily after supper. (Patient taking differently: Take 0.4 mg by mouth daily as needed (urinary flow). In the evening) 30 capsule 5   tolterodine (DETROL LA) 4 MG 24 hr capsule Take 4 mg by mouth daily.     Turmeric 500 MG CAPS See admin instructions.     Zinc 15 MG CAPS Take 15 mg by mouth daily.     No current facility-administered medications for this visit.    REVIEW OF SYSTEMS:    10 Point review of Systems was done is negative except as noted  above.  PHYSICAL EXAMINATION: ECOG PERFORMANCE STATUS: 2 - Symptomatic, <50% confined to bed  .There were no vitals filed for this visit. Filed Weights   01/26/22 1111  Weight: 139 lb 9.6 oz (63.3 kg)   .Body mass index is 22.53 kg/m.  GENERAL:alert, in no acute distress and comfortable SKIN: no acute rashes, no significant lesions EYES: conjunctiva are pink and non-injected, sclera anicteric OROPHARYNX: MMM, no exudates, no oropharyngeal erythema or ulceration NECK: supple, no JVD LYMPH:  no palpable lymphadenopathy in the cervical, axillary or inguinal regions LUNGS: clear to auscultation b/l with normal respiratory effort HEART: regular rate & rhythm ABDOMEN:  normoactive bowel sounds , non tender, not distended. Extremity: no pedal edema PSYCH: alert & oriented x 3 with fluent speech NEURO: no focal motor/sensory deficits  LABORATORY DATA:  I have reviewed the data as listed .    Latest Ref Rng & Units 01/26/2022   12:15 PM 01/26/2022   12:14 PM 09/13/2018    3:19 PM  CBC  WBC 4.0 - 10.5 K/uL  5.1  4.0   Hemoglobin 13.0 - 17.0 g/dL  11.5  10.8   Hematocrit 37.5 - 51.0 % 33.7  34.3  32.5   Platelets 150 - 400 K/uL  210  196    .CBC    Component Value Date/Time   WBC 5.1 01/26/2022 1214   RBC 3.38 (L) 01/26/2022 1214   HGB 11.5 (L) 01/26/2022 1214   HGB 10.8 (L) 09/13/2018 1519   HCT 33.7 (L) 01/26/2022 1215   HCT 34.3 (L) 01/26/2022 1214   PLT 210 01/26/2022 1214   PLT 196 09/13/2018 1519   MCV 101.5 (H) 01/26/2022 1214   MCH 34.0 01/26/2022 1214   MCHC 33.5 01/26/2022 1214   RDW 12.2 01/26/2022 1214   LYMPHSABS 0.7 01/26/2022 1214   MONOABS 0.8 01/26/2022 1214   EOSABS 0.3 01/26/2022 1214   BASOSABS 0.0 01/26/2022 1214    .    Latest Ref Rng & Units 01/26/2022   12:14 PM 09/13/2018    3:19 PM 11/15/2017   11:15 AM  CMP  Glucose 70 - 99 mg/dL 82  84  109   BUN 8 - 23 mg/dL 40  35  28   Creatinine 0.61 - 1.24 mg/dL 2.10  1.59  1.50   Sodium 135 - 145  mmol/L 139  139  141   Potassium 3.5 - 5.1 mmol/L 4.3  4.2  4.2   Chloride 98 - 111 mmol/L 105  105  102   CO2 22 - 32 mmol/L _0 Calcium 8.9 - 10.3 mg/dL 10.0  8.8  9.4   Total Protein 6.5 - 8.1 g/dL 8.4  7.3  7.1   Total Bilirubin 0.3 - 1.2 mg/dL 0.4  0.3  0.4   Alkaline Phos 38 - 126 U/L 74  76  73   AST 15 - 41 U/L _1 ALT 0 - 44 U/L _2 RADIOGRAPHIC STUDIES: I have personally reviewed the radiological images as listed and agreed with the findings in the report. No results found.  ASSESSMENT & PLAN:   86 yo with   1) Macrocytic Anemia ?b12 deficiency Cannot r/o MDS  2) leyucopenia- resolved  PLAN: -Will have labs done today.  -Discussed patient's recent lab results which showed he was anemic and his WBC were low.   -Educated patient to what can cause Anemia.  -Educated patient on MDS.  -Answered Patient's question regarding all labs.  -continue B12 and add OTC B complex 1 cap po daily. . Orders Placed This Encounter  Procedures   CBC with Differential/Platelet    Standing Status:   Future    Number of Occurrences:   1    Standing Expiration Date:   01/27/2023   CMP ([place] only)    Standing Status:   Future  Number of Occurrences:   1    Standing Expiration Date:   01/27/2023   Lactate dehydrogenase    Standing Status:   Future    Number of Occurrences:   1    Standing Expiration Date:   01/27/2023   Vitamin B12    Standing Status:   Future    Number of Occurrences:   1    Standing Expiration Date:   01/26/2023   Folate RBC    Standing Status:   Future    Number of Occurrences:   1    Standing Expiration Date:   01/27/2023   Myelodysplastic Syndrome (MDS) Panel    Standing Status:   Future    Number of Occurrences:   1    Standing Expiration Date:   01/26/2023   TSH    Standing Status:   Future    Number of Occurrences:   1    Standing Expiration Date:   01/26/2023   Copper, serum    Standing Status:    Future    Number of Occurrences:   1    Standing Expiration Date:   01/26/2023   Multiple Myeloma Panel (SPEP&IFE w/QIG)    Standing Status:   Future    Number of Occurrences:   1    Standing Expiration Date:   01/27/2023   Kappa/lambda light chains    Standing Status:   Future    Number of Occurrences:   1    Standing Expiration Date:   01/27/2023    FOLLOW-UP: Labs today Phone visit with Dr Irene Limbo in 2 weeks  .The total time spent in the appointment was 45 minutes* .  All of the patient's questions were answered with apparent satisfaction. The patient knows to call the clinic with any problems, questions or concerns.   Sullivan Lone MD MS AAHIVMS [facility] Hematology/Oncology Physician [facility]  .*Total Encounter Time as defined by the Centers for Medicare and Medicaid Services includes, in addition to the face-to-face time of a patient visit (documented in the note above) non-face-to-face time: obtaining and reviewing outside history, ordering and reviewing medications, tests or procedures, care coordination (communications with other health care professionals or caregivers) and documentation in the medical record.   01/26/2022 9:50 AM

## 2022-01-27 ENCOUNTER — Telehealth: Payer: Self-pay | Admitting: Hematology

## 2022-01-27 LAB — KAPPA/LAMBDA LIGHT CHAINS
Kappa free light chain: 114.9 mg/L — ABNORMAL HIGH (ref 3.3–19.4)
Kappa, lambda light chain ratio: 3.82 — ABNORMAL HIGH (ref 0.26–1.65)
Lambda free light chains: 30.1 mg/L — ABNORMAL HIGH (ref 5.7–26.3)

## 2022-01-27 LAB — FOLATE RBC
Folate, Hemolysate: >620 ng/mL
Folate, RBC: >1840 ng/mL (ref >498)
Hematocrit: 33.7 % — ABNORMAL LOW (ref 37.5–51.0)

## 2022-01-27 NOTE — Telephone Encounter (Signed)
Per 11/15 los called and spoke to pt about appointment

## 2022-01-28 DIAGNOSIS — Z961 Presence of intraocular lens: Secondary | ICD-10-CM | POA: Diagnosis not present

## 2022-01-28 DIAGNOSIS — H35371 Puckering of macula, right eye: Secondary | ICD-10-CM | POA: Diagnosis not present

## 2022-01-28 DIAGNOSIS — H34831 Tributary (branch) retinal vein occlusion, right eye, with macular edema: Secondary | ICD-10-CM | POA: Diagnosis not present

## 2022-01-31 LAB — MULTIPLE MYELOMA PANEL, SERUM
Albumin SerPl Elph-Mcnc: 4.1 g/dL (ref 2.9–4.4)
Albumin/Glob SerPl: 1.2 (ref 0.7–1.7)
Alpha 1: 0.3 g/dL (ref 0.0–0.4)
Alpha2 Glob SerPl Elph-Mcnc: 0.9 g/dL (ref 0.4–1.0)
B-Globulin SerPl Elph-Mcnc: 1 g/dL (ref 0.7–1.3)
Gamma Glob SerPl Elph-Mcnc: 1.4 g/dL (ref 0.4–1.8)
Globulin, Total: 3.6 g/dL (ref 2.2–3.9)
IgA: 191 mg/dL (ref 61–437)
IgG (Immunoglobin G), Serum: 1351 mg/dL (ref 603–1613)
IgM (Immunoglobulin M), Srm: 134 mg/dL (ref 15–143)
Total Protein ELP: 7.7 g/dL (ref 6.0–8.5)

## 2022-02-07 LAB — MYELODYSPLASTIC SYNDROME (MDS) PANEL

## 2022-02-07 LAB — COPPER, SERUM: Copper: 103 ug/dL (ref 69–132)

## 2022-02-09 ENCOUNTER — Inpatient Hospital Stay (HOSPITAL_BASED_OUTPATIENT_CLINIC_OR_DEPARTMENT_OTHER): Payer: Medicare Other | Admitting: Hematology

## 2022-02-09 DIAGNOSIS — E538 Deficiency of other specified B group vitamins: Secondary | ICD-10-CM | POA: Diagnosis not present

## 2022-02-09 DIAGNOSIS — D539 Nutritional anemia, unspecified: Secondary | ICD-10-CM

## 2022-02-09 DIAGNOSIS — D72819 Decreased white blood cell count, unspecified: Secondary | ICD-10-CM

## 2022-02-09 DIAGNOSIS — D649 Anemia, unspecified: Secondary | ICD-10-CM | POA: Diagnosis not present

## 2022-02-09 NOTE — Progress Notes (Signed)
HEMATOLOGY/ONCOLOGY PHONE VISIT CLINIC NOTE  Date of Service: 02/09/2022  Patient Care Team: Deland Pretty, MD as PCP - General (Internal Medicine) Minus Breeding, MD as PCP - Cardiology (Cardiology)  CHIEF COMPLAINTS/PURPOSE OF CONSULTATION:  Anemia and Mild Leukopenia  HISTORY OF PRESENTING ILLNESS:  Darrell Crossan. is a wonderful 86 y.o. male who has been referred to [place] by Dr. Deland Pretty, MD, for evaluation and management of Anemia and Mild Leukopenia.  Patient notes his lab results that showed anemia and mild leukopenia was his annual lab.   He notes he has been slightly anemic previously. He denies of any health problems in the last 6 months. He denies of any medicine changes in the past 6 months.   Patient denies using any OTC pain medication.   He was previously Vitamin-B12 deficient and he started taking Vitamin-B 2000 mg supplement, but denies taking B-Complex. He started this around 4 years ago. He is also taking folic acid.  He denies thyroid problems, recent surgeries, blood disorders, fever, chills, leg swelling, abdominal pain, abnormal bowl moments, bone pain, and unexpected weight loss. He reports he has been eating well with no dietary restrictions.   Patient notes he was diagnosed with prostate cancer in 2018. He was treated with external beam radiation and hormone therapy. His last hormone therapy was around January 2020. He regularly follows up with his specialist for prostate cancer.   He denies of enlarged spleen and any liver disease. Patient notes he used to drink 5-6 alcoholic drink a week, but has cut down to 1-2 alcoholic drink a week. He denies of being an heavy drinker in the past.   He notes he has squamous and basal skin cancer, which he regularly follows up with his Dermatologist every 3 months.   Interval History: Darrell Mcenroe. is a wonderful 86 y.o. male who is here for continued evaluation and management of Anemia and Mild Leukopenia.  P .I connected with Darrell Spanner. on 02/09/2022 at  8:40 AM EST by telephone visit and verified that I am speaking with the correct person using two identifiers.   Patient reports he has been doing well without any new medical concerns since our last visit.   He reports he used to regularly follow-up with his Nephrologist for his chronic kidney disease, but his nephrologist recently retired. He is currently trying to establish care with a new nephrologist.  Patient reports he currently drinks around 2-3 alcoholic beverages a week.  I discussed the limitations, risks, security and privacy concerns of performing an evaluation and management service by telemedicine and the availability of in-person appointments. I also discussed with the patient that there may be a patient responsible charge related to this service. The patient expressed understanding and agreed to proceed.   Other persons participating in the visit and their role in the encounter: None   Patient's location: Home  Provider's location: [facility]   Chief Complaint: Anemia and Mild Leukopenia     MEDICAL HISTORY:  Past Medical History:  Diagnosis Date   Dyslipidemia    GERD (gastroesophageal reflux disease)    History of idiopathic seizure    per pt age 68 or 62 had 2 seizure's , unknown cause, and no seizure's since   Hypertension    Prostate cancer ([place]) UROLOGIST-  DR DAHLSTEDT/  ONCOLOGIST-  DR MANNING/ DR Alen Blew   dx 08-10-2016 (bx)  Stage T2a,  Gleason 4+5,  PSA 6.1,  vol 38.85cc--- plan external  beam radiation    SURGICAL HISTORY: Past Surgical History:  Procedure Laterality Date   CATARACT EXTRACTION W/ INTRAOCULAR LENS IMPLANT Right 2015   COLONOSCOPY  last one 2017   EYE SURGERY Right 2010   removal epiretinal membrane   GOLD SEED IMPLANT N/A 12/09/2016   Procedure: GOLD SEED IMPLANT;  Surgeon: Franchot Gallo, MD;  Location: [facility];  Service: Urology;  Laterality: N/A;   INGUINAL  HERNIA REPAIR Bilateral 1980s;  1990   SPACE OAR INSTILLATION N/A 12/09/2016   Procedure: SPACE OAR INSTILLATION;  Surgeon: Franchot Gallo, MD;  Location: [facility];  Service: Urology;  Laterality: N/A;   TONSILLECTOMY  child    SOCIAL HISTORY: Social History   Socioeconomic History   Marital status: Married    Spouse name: Not on file   Number of children: Not on file   Years of education: Not on file   Highest education level: Not on file  Occupational History   Not on file  Tobacco Use   Smoking status: Never   Smokeless tobacco: Never  Vaping Use   Vaping Use: Never used  Substance and Sexual Activity   Alcohol use: Yes    Alcohol/week: 2.0 standard drinks of alcohol    Types: 2 Shots of liquor per week    Comment: OCCASIONAL   Drug use: No   Sexual activity: Not on file  Other Topics Concern   Not on file  Social History Narrative   Lives with wife.  Retired [employer].     Social Determinants of Health   Financial Resource Strain: Not on file  Food Insecurity: Not on file  Transportation Needs: Not on file  Physical Activity: Not on file  Stress: Not on file  Social Connections: Not on file  Intimate Partner Violence: Not on file    FAMILY HISTORY: Family History  Problem Relation Age of Onset   Pulmonary embolism Father     ALLERGIES:  is allergic to other.  MEDICATIONS:  Current Outpatient Medications  Medication Sig Dispense Refill   amLODipine (NORVASC) 10 MG tablet Take 5 mg by mouth at bedtime.      cyanocobalamin 1000 MCG tablet Take 1,000 mcg by mouth 2 (two) times daily.     dapagliflozin propanediol (FARXIGA) 5 MG TABS tablet 1 tablet     famotidine (PEPCID) 10 MG tablet 1 tablet as needed     Ferrous Sulfate (IRON) 28 MG TABS Take 60 mg by mouth once a week.     folic acid (FOLVITE) 854 MCG tablet 1 tablet     fosinopril (MONOPRIL) 40 MG tablet Take 40 mg by mouth in the morning and at bedtime. Take 40 mg in the morning and  40 mg in the evening     hydrochlorothiazide (MICROZIDE) 12.5 MG capsule Take 12.5 mg by mouth daily. (Patient not taking: Reported on 01/26/2022)     LORazepam (ATIVAN) 1 MG tablet Take 0.5 mg by mouth at bedtime as needed for anxiety or sleep.      metoprolol tartrate (LOPRESSOR) 25 MG tablet Take 12.5 mg by mouth every evening. Pt states he takes half a tablet daily (Patient not taking: Reported on 01/26/2022)     MINOXIDIL EX Apply 1 application topically 2 (two) times daily.     Multiple Vitamin (MULTIVITAMIN WITH MINERALS) TABS tablet Take 1 tablet by mouth 2 (two) times a week.     PARoxetine (PAXIL) 20 MG tablet Take 20 mg by mouth every evening.  ranitidine (ZANTAC) 150 MG tablet Take 150 mg by mouth daily as needed for heartburn. (Patient not taking: Reported on 01/26/2022)     rosuvastatin (CRESTOR) 40 MG tablet 1 tablet     tamsulosin (FLOMAX) 0.4 MG CAPS capsule Take 1 capsule (0.4 mg total) by mouth daily after supper. (Patient not taking: Reported on 01/26/2022) 30 capsule 5   tolterodine (DETROL LA) 4 MG 24 hr capsule Take 4 mg by mouth daily. (Patient not taking: Reported on 01/26/2022)     Turmeric 500 MG CAPS See admin instructions.     Zinc 15 MG CAPS Take 15 mg by mouth daily.     No current facility-administered medications for this visit.    REVIEW OF SYSTEMS:    10 Point review of Systems was done is negative except as noted above.  PHYSICAL EXAMINATION: TELEMEDICINE VISIT  LABORATORY DATA:  I have reviewed the data as listed .    Latest Ref Rng & Units 01/26/2022   12:15 PM 01/26/2022   12:14 PM 09/13/2018    3:19 PM  CBC  WBC 4.0 - 10.5 K/uL  5.1  4.0   Hemoglobin 13.0 - 17.0 g/dL  11.5  10.8   Hematocrit 37.5 - 51.0 % 33.7  34.3  32.5   Platelets 150 - 400 K/uL  210  196    .CBC    Component Value Date/Time   WBC 5.1 01/26/2022 1214   RBC 3.38 (L) 01/26/2022 1214   HGB 11.5 (L) 01/26/2022 1214   HGB 10.8 (L) 09/13/2018 1519   HCT 33.7 (L)  01/26/2022 1215   HCT 34.3 (L) 01/26/2022 1214   PLT 210 01/26/2022 1214   PLT 196 09/13/2018 1519   MCV 101.5 (H) 01/26/2022 1214   MCH 34.0 01/26/2022 1214   MCHC 33.5 01/26/2022 1214   RDW 12.2 01/26/2022 1214   LYMPHSABS 0.7 01/26/2022 1214   MONOABS 0.8 01/26/2022 1214   EOSABS 0.3 01/26/2022 1214   BASOSABS 0.0 01/26/2022 1214    .    Latest Ref Rng & Units 01/26/2022   12:14 PM 09/13/2018    3:19 PM 11/15/2017   11:15 AM  CMP  Glucose 70 - 99 mg/dL 82  84  109   BUN 8 - 23 mg/dL 40  35  28   Creatinine 0.61 - 1.24 mg/dL 2.10  1.59  1.50   Sodium 135 - 145 mmol/L 139  139  141   Potassium 3.5 - 5.1 mmol/L 4.3  4.2  4.2   Chloride 98 - 111 mmol/L 105  105  102   CO2 22 - 32 mmol/L '27  24  30   '$ Calcium 8.9 - 10.3 mg/dL 10.0  8.8  9.4   Total Protein 6.5 - 8.1 g/dL 8.4  7.3  7.1   Total Bilirubin 0.3 - 1.2 mg/dL 0.4  0.3  0.4   Alkaline Phos 38 - 126 U/L 74  76  73   AST 15 - 41 U/L '28  22  24   '$ ALT 0 - 44 U/L '23  15  13          '$ RADIOGRAPHIC STUDIES: I have personally reviewed the radiological images as listed and agreed with the findings in the report. No results found.  ASSESSMENT & PLAN:   86 yo with   1) Macrocytic Anemia ?b12 deficiency Cannot r/o MDS  2) leyucopenia- resolved  PLAN: -Discussed lab results from 01/26/2022 with the patient. Labs showed WBC of 5.1 K,  hemoglobin of 11.5 K, platelets of 210K, creatine of 2.10. Kappa lambda light chain ratio was 3.82.  Myeloma panel with no M spike -copper, RBC folate and B12 levels wnl TSH WNL LDH WNL -Discussed the results of MDS FISH panel which were unrevealing for MDS -could be low risk MDS -- patient prefers to hold off on BM Bx at this time. -Recommend to follow up with PCP with lab to watch the kappa lambda light chain ratio and blood counts and reconsult [place] if worsening cytopenias. -Recommend to continue B complex 1 cap po daily  FOLLOW-UP: RTC with PCP RTC with Dr Irene Limbo as needed  The  total time spent in the appointment was 20 minutes* .  All of the patient's questions were answered with apparent satisfaction. The patient knows to call the clinic with any problems, questions or concerns.   Sullivan Lone MD MS AAHIVMS [facility] Hematology/Oncology Physician [facility]  .*Total Encounter Time as defined by the Centers for Medicare and Medicaid Services includes, in addition to the face-to-face time of a patient visit (documented in the note above) non-face-to-face time: obtaining and reviewing outside history, ordering and reviewing medications, tests or procedures, care coordination (communications with other health care professionals or caregivers) and documentation in the medical record.   I, Cleda Mccreedy, am acting as a Education administrator for Sullivan Lone, MD.  .I have reviewed the above documentation for accuracy and completeness, and I agree with the above. Brunetta Genera MD

## 2022-03-01 DIAGNOSIS — Z8546 Personal history of malignant neoplasm of prostate: Secondary | ICD-10-CM | POA: Diagnosis not present

## 2022-03-03 DIAGNOSIS — H109 Unspecified conjunctivitis: Secondary | ICD-10-CM | POA: Diagnosis not present

## 2022-04-22 DIAGNOSIS — K529 Noninfective gastroenteritis and colitis, unspecified: Secondary | ICD-10-CM | POA: Diagnosis not present

## 2022-04-26 DIAGNOSIS — Z85828 Personal history of other malignant neoplasm of skin: Secondary | ICD-10-CM | POA: Diagnosis not present

## 2022-04-26 DIAGNOSIS — L570 Actinic keratosis: Secondary | ICD-10-CM | POA: Diagnosis not present

## 2022-04-26 DIAGNOSIS — L821 Other seborrheic keratosis: Secondary | ICD-10-CM | POA: Diagnosis not present

## 2022-04-26 DIAGNOSIS — L814 Other melanin hyperpigmentation: Secondary | ICD-10-CM | POA: Diagnosis not present

## 2022-04-26 DIAGNOSIS — D485 Neoplasm of uncertain behavior of skin: Secondary | ICD-10-CM | POA: Diagnosis not present

## 2022-06-28 DIAGNOSIS — H34831 Tributary (branch) retinal vein occlusion, right eye, with macular edema: Secondary | ICD-10-CM | POA: Diagnosis not present

## 2022-06-28 DIAGNOSIS — Z961 Presence of intraocular lens: Secondary | ICD-10-CM | POA: Diagnosis not present

## 2022-06-28 DIAGNOSIS — H35371 Puckering of macula, right eye: Secondary | ICD-10-CM | POA: Diagnosis not present

## 2022-07-26 DIAGNOSIS — D1801 Hemangioma of skin and subcutaneous tissue: Secondary | ICD-10-CM | POA: Diagnosis not present

## 2022-07-26 DIAGNOSIS — L570 Actinic keratosis: Secondary | ICD-10-CM | POA: Diagnosis not present

## 2022-07-26 DIAGNOSIS — C44722 Squamous cell carcinoma of skin of right lower limb, including hip: Secondary | ICD-10-CM | POA: Diagnosis not present

## 2022-07-26 DIAGNOSIS — D485 Neoplasm of uncertain behavior of skin: Secondary | ICD-10-CM | POA: Diagnosis not present

## 2022-07-26 DIAGNOSIS — Z85828 Personal history of other malignant neoplasm of skin: Secondary | ICD-10-CM | POA: Diagnosis not present

## 2022-07-26 DIAGNOSIS — C44729 Squamous cell carcinoma of skin of left lower limb, including hip: Secondary | ICD-10-CM | POA: Diagnosis not present

## 2022-07-26 DIAGNOSIS — L603 Nail dystrophy: Secondary | ICD-10-CM | POA: Diagnosis not present

## 2022-07-26 DIAGNOSIS — L821 Other seborrheic keratosis: Secondary | ICD-10-CM | POA: Diagnosis not present

## 2022-08-11 DIAGNOSIS — N051 Unspecified nephritic syndrome with focal and segmental glomerular lesions: Secondary | ICD-10-CM | POA: Diagnosis not present

## 2022-08-11 DIAGNOSIS — I10 Essential (primary) hypertension: Secondary | ICD-10-CM | POA: Diagnosis not present

## 2022-08-11 DIAGNOSIS — N1832 Chronic kidney disease, stage 3b: Secondary | ICD-10-CM | POA: Diagnosis not present

## 2022-08-11 DIAGNOSIS — E875 Hyperkalemia: Secondary | ICD-10-CM | POA: Diagnosis not present

## 2022-08-11 DIAGNOSIS — Z87442 Personal history of urinary calculi: Secondary | ICD-10-CM | POA: Diagnosis not present

## 2022-08-16 DIAGNOSIS — C61 Malignant neoplasm of prostate: Secondary | ICD-10-CM | POA: Diagnosis not present

## 2022-08-23 DIAGNOSIS — E7800 Pure hypercholesterolemia, unspecified: Secondary | ICD-10-CM | POA: Diagnosis not present

## 2022-08-24 DIAGNOSIS — Z8546 Personal history of malignant neoplasm of prostate: Secondary | ICD-10-CM | POA: Diagnosis not present

## 2022-08-24 DIAGNOSIS — N3281 Overactive bladder: Secondary | ICD-10-CM | POA: Diagnosis not present

## 2022-09-14 DIAGNOSIS — H5203 Hypermetropia, bilateral: Secondary | ICD-10-CM | POA: Diagnosis not present

## 2022-09-14 DIAGNOSIS — H52223 Regular astigmatism, bilateral: Secondary | ICD-10-CM | POA: Diagnosis not present

## 2022-09-14 DIAGNOSIS — H524 Presbyopia: Secondary | ICD-10-CM | POA: Diagnosis not present

## 2022-10-17 DIAGNOSIS — L245 Irritant contact dermatitis due to other chemical products: Secondary | ICD-10-CM | POA: Diagnosis not present

## 2022-10-17 DIAGNOSIS — L812 Freckles: Secondary | ICD-10-CM | POA: Diagnosis not present

## 2022-10-17 DIAGNOSIS — L821 Other seborrheic keratosis: Secondary | ICD-10-CM | POA: Diagnosis not present

## 2022-10-17 DIAGNOSIS — L814 Other melanin hyperpigmentation: Secondary | ICD-10-CM | POA: Diagnosis not present

## 2022-10-17 DIAGNOSIS — Z85828 Personal history of other malignant neoplasm of skin: Secondary | ICD-10-CM | POA: Diagnosis not present

## 2022-10-17 DIAGNOSIS — C44722 Squamous cell carcinoma of skin of right lower limb, including hip: Secondary | ICD-10-CM | POA: Diagnosis not present

## 2022-10-20 DIAGNOSIS — H109 Unspecified conjunctivitis: Secondary | ICD-10-CM | POA: Diagnosis not present

## 2022-10-20 DIAGNOSIS — B9689 Other specified bacterial agents as the cause of diseases classified elsewhere: Secondary | ICD-10-CM | POA: Diagnosis not present

## 2022-10-24 DIAGNOSIS — Z85828 Personal history of other malignant neoplasm of skin: Secondary | ICD-10-CM | POA: Diagnosis not present

## 2022-10-24 DIAGNOSIS — C44722 Squamous cell carcinoma of skin of right lower limb, including hip: Secondary | ICD-10-CM | POA: Diagnosis not present

## 2022-10-26 DIAGNOSIS — H109 Unspecified conjunctivitis: Secondary | ICD-10-CM | POA: Diagnosis not present

## 2022-10-26 DIAGNOSIS — B9689 Other specified bacterial agents as the cause of diseases classified elsewhere: Secondary | ICD-10-CM | POA: Diagnosis not present

## 2022-11-15 DIAGNOSIS — N1832 Chronic kidney disease, stage 3b: Secondary | ICD-10-CM | POA: Diagnosis not present

## 2022-11-22 DIAGNOSIS — N1832 Chronic kidney disease, stage 3b: Secondary | ICD-10-CM | POA: Diagnosis not present

## 2022-11-22 DIAGNOSIS — Z87442 Personal history of urinary calculi: Secondary | ICD-10-CM | POA: Diagnosis not present

## 2022-11-22 DIAGNOSIS — I10 Essential (primary) hypertension: Secondary | ICD-10-CM | POA: Diagnosis not present

## 2022-11-22 DIAGNOSIS — E875 Hyperkalemia: Secondary | ICD-10-CM | POA: Diagnosis not present

## 2022-11-22 DIAGNOSIS — N051 Unspecified nephritic syndrome with focal and segmental glomerular lesions: Secondary | ICD-10-CM | POA: Diagnosis not present

## 2022-12-14 DIAGNOSIS — H5789 Other specified disorders of eye and adnexa: Secondary | ICD-10-CM | POA: Diagnosis not present

## 2022-12-16 DIAGNOSIS — Z23 Encounter for immunization: Secondary | ICD-10-CM | POA: Diagnosis not present

## 2023-01-24 DIAGNOSIS — Z85828 Personal history of other malignant neoplasm of skin: Secondary | ICD-10-CM | POA: Diagnosis not present

## 2023-01-24 DIAGNOSIS — L821 Other seborrheic keratosis: Secondary | ICD-10-CM | POA: Diagnosis not present

## 2023-01-24 DIAGNOSIS — L814 Other melanin hyperpigmentation: Secondary | ICD-10-CM | POA: Diagnosis not present

## 2023-01-24 DIAGNOSIS — D2271 Melanocytic nevi of right lower limb, including hip: Secondary | ICD-10-CM | POA: Diagnosis not present

## 2023-01-24 DIAGNOSIS — D2262 Melanocytic nevi of left upper limb, including shoulder: Secondary | ICD-10-CM | POA: Diagnosis not present

## 2023-01-24 DIAGNOSIS — L570 Actinic keratosis: Secondary | ICD-10-CM | POA: Diagnosis not present

## 2023-01-24 DIAGNOSIS — L218 Other seborrheic dermatitis: Secondary | ICD-10-CM | POA: Diagnosis not present

## 2023-01-24 DIAGNOSIS — C44722 Squamous cell carcinoma of skin of right lower limb, including hip: Secondary | ICD-10-CM | POA: Diagnosis not present

## 2023-02-06 DIAGNOSIS — Z85828 Personal history of other malignant neoplasm of skin: Secondary | ICD-10-CM | POA: Diagnosis not present

## 2023-02-06 DIAGNOSIS — C44722 Squamous cell carcinoma of skin of right lower limb, including hip: Secondary | ICD-10-CM | POA: Diagnosis not present

## 2023-02-13 DIAGNOSIS — I10 Essential (primary) hypertension: Secondary | ICD-10-CM | POA: Diagnosis not present

## 2023-02-13 DIAGNOSIS — E7800 Pure hypercholesterolemia, unspecified: Secondary | ICD-10-CM | POA: Diagnosis not present

## 2023-02-15 DIAGNOSIS — N1832 Chronic kidney disease, stage 3b: Secondary | ICD-10-CM | POA: Diagnosis not present

## 2023-02-20 DIAGNOSIS — I2510 Atherosclerotic heart disease of native coronary artery without angina pectoris: Secondary | ICD-10-CM | POA: Diagnosis not present

## 2023-02-20 DIAGNOSIS — F419 Anxiety disorder, unspecified: Secondary | ICD-10-CM | POA: Diagnosis not present

## 2023-02-20 DIAGNOSIS — N1832 Chronic kidney disease, stage 3b: Secondary | ICD-10-CM | POA: Diagnosis not present

## 2023-02-20 DIAGNOSIS — Z0000 Encounter for general adult medical examination without abnormal findings: Secondary | ICD-10-CM | POA: Diagnosis not present

## 2023-02-20 DIAGNOSIS — D539 Nutritional anemia, unspecified: Secondary | ICD-10-CM | POA: Diagnosis not present

## 2023-02-20 DIAGNOSIS — N051 Unspecified nephritic syndrome with focal and segmental glomerular lesions: Secondary | ICD-10-CM | POA: Diagnosis not present

## 2023-02-20 DIAGNOSIS — C61 Malignant neoplasm of prostate: Secondary | ICD-10-CM | POA: Diagnosis not present

## 2023-02-20 DIAGNOSIS — D631 Anemia in chronic kidney disease: Secondary | ICD-10-CM | POA: Diagnosis not present

## 2023-02-20 DIAGNOSIS — I10 Essential (primary) hypertension: Secondary | ICD-10-CM | POA: Diagnosis not present

## 2023-02-22 ENCOUNTER — Other Ambulatory Visit: Payer: Self-pay | Admitting: Nephrology

## 2023-02-22 DIAGNOSIS — N1832 Chronic kidney disease, stage 3b: Secondary | ICD-10-CM

## 2023-02-22 DIAGNOSIS — Z87442 Personal history of urinary calculi: Secondary | ICD-10-CM

## 2023-02-22 DIAGNOSIS — E875 Hyperkalemia: Secondary | ICD-10-CM

## 2023-02-22 DIAGNOSIS — I10 Essential (primary) hypertension: Secondary | ICD-10-CM | POA: Diagnosis not present

## 2023-02-22 DIAGNOSIS — N051 Unspecified nephritic syndrome with focal and segmental glomerular lesions: Secondary | ICD-10-CM

## 2023-02-23 ENCOUNTER — Ambulatory Visit
Admission: RE | Admit: 2023-02-23 | Discharge: 2023-02-23 | Disposition: A | Discharge status: Home / Self Care | Payer: Medicare Other | Source: Ambulatory Visit | Attending: Nephrology | Admitting: Nephrology

## 2023-02-23 DIAGNOSIS — N051 Unspecified nephritic syndrome with focal and segmental glomerular lesions: Secondary | ICD-10-CM

## 2023-02-23 DIAGNOSIS — N1832 Chronic kidney disease, stage 3b: Secondary | ICD-10-CM

## 2023-02-23 DIAGNOSIS — E875 Hyperkalemia: Secondary | ICD-10-CM

## 2023-02-23 DIAGNOSIS — Z87442 Personal history of urinary calculi: Secondary | ICD-10-CM

## 2023-02-23 DIAGNOSIS — N289 Disorder of kidney and ureter, unspecified: Secondary | ICD-10-CM | POA: Diagnosis not present

## 2023-03-01 DIAGNOSIS — C44722 Squamous cell carcinoma of skin of right lower limb, including hip: Secondary | ICD-10-CM | POA: Diagnosis not present

## 2023-03-01 DIAGNOSIS — D485 Neoplasm of uncertain behavior of skin: Secondary | ICD-10-CM | POA: Diagnosis not present

## 2023-03-01 DIAGNOSIS — Z85828 Personal history of other malignant neoplasm of skin: Secondary | ICD-10-CM | POA: Diagnosis not present

## 2023-05-02 DIAGNOSIS — L570 Actinic keratosis: Secondary | ICD-10-CM | POA: Diagnosis not present

## 2023-05-02 DIAGNOSIS — Z85828 Personal history of other malignant neoplasm of skin: Secondary | ICD-10-CM | POA: Diagnosis not present

## 2023-05-02 DIAGNOSIS — D692 Other nonthrombocytopenic purpura: Secondary | ICD-10-CM | POA: Diagnosis not present

## 2023-05-02 DIAGNOSIS — L821 Other seborrheic keratosis: Secondary | ICD-10-CM | POA: Diagnosis not present

## 2023-05-02 DIAGNOSIS — L814 Other melanin hyperpigmentation: Secondary | ICD-10-CM | POA: Diagnosis not present

## 2023-05-17 DIAGNOSIS — N1832 Chronic kidney disease, stage 3b: Secondary | ICD-10-CM | POA: Diagnosis not present

## 2023-05-23 DIAGNOSIS — N1832 Chronic kidney disease, stage 3b: Secondary | ICD-10-CM | POA: Diagnosis not present

## 2023-05-23 DIAGNOSIS — E875 Hyperkalemia: Secondary | ICD-10-CM | POA: Diagnosis not present

## 2023-05-23 DIAGNOSIS — I129 Hypertensive chronic kidney disease with stage 1 through stage 4 chronic kidney disease, or unspecified chronic kidney disease: Secondary | ICD-10-CM | POA: Diagnosis not present

## 2023-05-23 DIAGNOSIS — N051 Unspecified nephritic syndrome with focal and segmental glomerular lesions: Secondary | ICD-10-CM | POA: Diagnosis not present

## 2023-05-23 DIAGNOSIS — Z87442 Personal history of urinary calculi: Secondary | ICD-10-CM | POA: Diagnosis not present

## 2023-05-23 DIAGNOSIS — I10 Essential (primary) hypertension: Secondary | ICD-10-CM | POA: Diagnosis not present

## 2023-06-09 DIAGNOSIS — N051 Unspecified nephritic syndrome with focal and segmental glomerular lesions: Secondary | ICD-10-CM | POA: Diagnosis not present

## 2023-07-12 DIAGNOSIS — E875 Hyperkalemia: Secondary | ICD-10-CM | POA: Diagnosis not present

## 2023-07-12 DIAGNOSIS — N1832 Chronic kidney disease, stage 3b: Secondary | ICD-10-CM | POA: Diagnosis not present

## 2023-07-12 DIAGNOSIS — I10 Essential (primary) hypertension: Secondary | ICD-10-CM | POA: Diagnosis not present

## 2023-07-12 DIAGNOSIS — N051 Unspecified nephritic syndrome with focal and segmental glomerular lesions: Secondary | ICD-10-CM | POA: Diagnosis not present

## 2023-08-01 DIAGNOSIS — L570 Actinic keratosis: Secondary | ICD-10-CM | POA: Diagnosis not present

## 2023-08-01 DIAGNOSIS — C44722 Squamous cell carcinoma of skin of right lower limb, including hip: Secondary | ICD-10-CM | POA: Diagnosis not present

## 2023-08-01 DIAGNOSIS — C44729 Squamous cell carcinoma of skin of left lower limb, including hip: Secondary | ICD-10-CM | POA: Diagnosis not present

## 2023-08-01 DIAGNOSIS — Z85828 Personal history of other malignant neoplasm of skin: Secondary | ICD-10-CM | POA: Diagnosis not present

## 2023-08-01 DIAGNOSIS — D692 Other nonthrombocytopenic purpura: Secondary | ICD-10-CM | POA: Diagnosis not present

## 2023-08-01 DIAGNOSIS — L821 Other seborrheic keratosis: Secondary | ICD-10-CM | POA: Diagnosis not present

## 2023-08-23 DIAGNOSIS — Z8546 Personal history of malignant neoplasm of prostate: Secondary | ICD-10-CM | POA: Diagnosis not present

## 2023-08-30 DIAGNOSIS — N3281 Overactive bladder: Secondary | ICD-10-CM | POA: Diagnosis not present

## 2023-08-30 DIAGNOSIS — Z8546 Personal history of malignant neoplasm of prostate: Secondary | ICD-10-CM | POA: Diagnosis not present

## 2023-08-30 DIAGNOSIS — R350 Frequency of micturition: Secondary | ICD-10-CM | POA: Diagnosis not present

## 2023-08-30 DIAGNOSIS — N4889 Other specified disorders of penis: Secondary | ICD-10-CM | POA: Diagnosis not present

## 2023-08-30 DIAGNOSIS — N5201 Erectile dysfunction due to arterial insufficiency: Secondary | ICD-10-CM | POA: Diagnosis not present

## 2023-08-30 DIAGNOSIS — H109 Unspecified conjunctivitis: Secondary | ICD-10-CM | POA: Diagnosis not present

## 2023-09-05 ENCOUNTER — Other Ambulatory Visit: Payer: Self-pay | Admitting: Urology

## 2023-09-13 NOTE — Patient Instructions (Signed)
 SURGICAL WAITING ROOM VISITATION  Patients having surgery or a procedure may have no more than 2 support people in the waiting area - these visitors may rotate.    Children under the age of 65 must have an adult with them who is not the patient.  Visitors with respiratory illnesses are discouraged from visiting and should remain at home.  If the patient needs to stay at the hospital during part of their recovery, the visitor guidelines for inpatient rooms apply. Pre-op nurse will coordinate an appropriate time for 1 support person to accompany patient in pre-op.  This support person may not rotate.    Please refer to the [facility] website for the visitor guidelines for Inpatients (after your surgery is over and you are in a regular room).       Your procedure is scheduled on: 09/26/23   Report to [facility] Main Entrance    Report to admitting at : 5:15 AM   Call this number if you have problems the morning of surgery [phone]   Do not eat food or drink: After Midnight.  FOLLOW AND ANY ADDITIONAL PRE OP INSTRUCTIONS YOU RECEIVED FROM YOUR SURGEON'S OFFICE!!!   Oral Hygiene is also important to reduce your risk of infection.                                    Remember - BRUSH YOUR TEETH THE MORNING OF SURGERY WITH YOUR REGULAR TOOTHPASTE  DENTURES WILL BE REMOVED PRIOR TO SURGERY PLEASE DO NOT APPLY Poly grip OR ADHESIVES!!!   Do NOT smoke after Midnight   Stop all vitamins and herbal supplements 7 days before surgery.   Take these medicines the morning of surgery with A SIP OF WATER: amlodipine,vibegron.Lorazepam as needed.  HOLD JARDIANCE AFTER: 09/22/23  DO NOT TAKE ANY ORAL DIABETIC MEDICATIONS DAY OF YOUR SURGERY                              You may not have any metal on your body including hair pins, jewelry, and body piercing             Do not wear lotions, powders, perfumes/cologne, or deodorant              Men may shave face and neck.   Do not  bring valuables to the hospital. [facility] IS NOT             RESPONSIBLE   FOR VALUABLES.   Contacts, glasses, dentures or bridgework may not be worn into surgery.   Bring small overnight bag day of surgery.   DO NOT BRING YOUR HOME MEDICATIONS TO THE HOSPITAL. PHARMACY WILL DISPENSE MEDICATIONS LISTED ON YOUR MEDICATION LIST TO YOU DURING YOUR ADMISSION IN THE HOSPITAL!    Patients discharged on the day of surgery will not be allowed to drive home.  Someone NEEDS to stay with you for the first 24 hours after anesthesia.   Special Instructions: Bring a copy of your healthcare power of attorney and living will documents the day of surgery if you haven't scanned them before.              Please read over the following fact sheets you were given: IF YOU HAVE QUESTIONS ABOUT YOUR PRE-OP INSTRUCTIONS PLEASE CALL [phone]   If you received a COVID test during your  pre-op visit  it is requested that you wear a mask when out in public, stay away from anyone that may not be feeling well and notify your surgeon if you develop symptoms. If you test positive for Covid or have been in contact with anyone that has tested positive in the last 10 days please notify you surgeon.    [facility] - Preparing for Surgery Before surgery, you can play an important role.  Because skin is not sterile, your skin needs to be as free of germs as possible.  You can reduce the number of germs on your skin by washing with CHG (chlorahexidine gluconate) soap before surgery.  CHG is an antiseptic cleaner which kills germs and bonds with the skin to continue killing germs even after washing. Please DO NOT use if you have an allergy to CHG or antibacterial soaps.  If your skin becomes reddened/irritated stop using the CHG and inform your nurse when you arrive at Short Stay. Do not shave (including legs and underarms) for at least 48 hours prior to the first CHG shower.  You may shave your face/neck. Please follow these  instructions carefully:  1.  Shower with CHG Soap the night before surgery and the  morning of Surgery.  2.  If you choose to wash your hair, wash your hair first as usual with your  normal  shampoo.  3.  After you shampoo, rinse your hair and body thoroughly to remove the  shampoo.                           4.  Use CHG as you would any other liquid soap.  You can apply chg directly  to the skin and wash                       Gently with a scrungie or clean washcloth.  5.  Apply the CHG Soap to your body ONLY FROM THE NECK DOWN.   Do not use on face/ open                           Wound or open sores. Avoid contact with eyes, ears mouth and genitals (private parts).                       Wash face,  Genitals (private parts) with your normal soap.             6.  Wash thoroughly, paying special attention to the area where your surgery  will be performed.  7.  Thoroughly rinse your body with warm water from the neck down.  8.  DO NOT shower/wash with your normal soap after using and rinsing off  the CHG Soap.                9.  Pat yourself dry with a clean towel.            10.  Wear clean pajamas.            11.  Place clean sheets on your bed the night of your first shower and do not  sleep with pets. Day of Surgery : Do not apply any lotions/deodorants the morning of surgery.  Please wear clean clothes to the hospital/surgery center.  FAILURE TO FOLLOW THESE INSTRUCTIONS MAY RESULT IN THE CANCELLATION OF YOUR SURGERY PATIENT SIGNATURE_________________________________  NURSE SIGNATURE__________________________________  ________________________________________________________________________

## 2023-09-14 ENCOUNTER — Encounter (HOSPITAL_COMMUNITY): Payer: Self-pay

## 2023-09-14 ENCOUNTER — Other Ambulatory Visit: Payer: Self-pay

## 2023-09-14 ENCOUNTER — Encounter (HOSPITAL_COMMUNITY)
Admission: RE | Admit: 2023-09-14 | Discharge: 2023-09-14 | Disposition: A | Discharge status: Home / Self Care | Source: Ambulatory Visit | Attending: Urology | Admitting: Urology

## 2023-09-14 VITALS — BP 141/76 | HR 65 | Temp 98.1°F | Ht 66.5 in | Wt 141.0 lb

## 2023-09-14 DIAGNOSIS — I444 Left anterior fascicular block: Secondary | ICD-10-CM | POA: Diagnosis not present

## 2023-09-14 DIAGNOSIS — I10 Essential (primary) hypertension: Secondary | ICD-10-CM

## 2023-09-14 DIAGNOSIS — Z8546 Personal history of malignant neoplasm of prostate: Secondary | ICD-10-CM | POA: Insufficient documentation

## 2023-09-14 DIAGNOSIS — K219 Gastro-esophageal reflux disease without esophagitis: Secondary | ICD-10-CM | POA: Diagnosis not present

## 2023-09-14 DIAGNOSIS — N489 Disorder of penis, unspecified: Secondary | ICD-10-CM | POA: Insufficient documentation

## 2023-09-14 DIAGNOSIS — N184 Chronic kidney disease, stage 4 (severe): Secondary | ICD-10-CM | POA: Insufficient documentation

## 2023-09-14 DIAGNOSIS — I129 Hypertensive chronic kidney disease with stage 1 through stage 4 chronic kidney disease, or unspecified chronic kidney disease: Secondary | ICD-10-CM | POA: Insufficient documentation

## 2023-09-14 DIAGNOSIS — Z01818 Encounter for other preprocedural examination: Secondary | ICD-10-CM | POA: Insufficient documentation

## 2023-09-14 HISTORY — DX: Chronic kidney disease, unspecified: N18.9

## 2023-09-14 HISTORY — DX: Unspecified asthma, uncomplicated: J45.909

## 2023-09-14 HISTORY — DX: Personal history of urinary calculi: Z87.442

## 2023-09-14 HISTORY — DX: Anemia, unspecified: D64.9

## 2023-09-14 LAB — BASIC METABOLIC PANEL WITH GFR
Anion gap: 10 (ref 5–15)
BUN: 51 mg/dL — ABNORMAL HIGH (ref 8–23)
CO2: 22 mmol/L (ref 22–32)
Calcium: 8.9 mg/dL (ref 8.9–10.3)
Chloride: 104 mmol/L (ref 98–111)
Creatinine, Ser: 2.5 mg/dL — ABNORMAL HIGH (ref 0.61–1.24)
GFR, Estimated: 25 mL/min — ABNORMAL LOW (ref >60)
Glucose, Bld: 93 mg/dL (ref 70–99)
Potassium: 4.9 mmol/L (ref 3.5–5.1)
Sodium: 136 mmol/L (ref 135–145)

## 2023-09-14 LAB — CBC
HCT: 29.4 % — ABNORMAL LOW (ref 39.0–52.0)
Hemoglobin: 9.5 g/dL — ABNORMAL LOW (ref 13.0–17.0)
MCH: 33.1 pg (ref 26.0–34.0)
MCHC: 32.3 g/dL (ref 30.0–36.0)
MCV: 102.4 fL — ABNORMAL HIGH (ref 80.0–100.0)
Platelets: 214 10*3/uL (ref 150–400)
RBC: 2.87 MIL/uL — ABNORMAL LOW (ref 4.22–5.81)
RDW: 12.4 % (ref 11.5–15.5)
WBC: 4.9 10*3/uL (ref 4.0–10.5)
nRBC: 0 % (ref 0.0–0.2)

## 2023-09-14 NOTE — Progress Notes (Addendum)
 For Anesthesia: PCP - Clarice Nottingham, MD Cardiologist - Lavona Agent, MD LOV: 03/02/21  Bowel Prep reminder:  Chest x-ray - CT Cardiac: 01/12/21 EKG -  Stress Test - 03/26/21 ECHO -  Cardiac Cath -  Pacemaker/ICD device last checked: Pacemaker orders received: Device Rep notified:  Spinal Cord Stimulator:N/A  Sleep Study - N/A CPAP -   Fasting Blood Sugar - N/A Checks Blood Sugar _____ times a day Date and result of last Hgb A1c-  Last dose of GLP1 agonist- N/A GLP1 instructions:   Last dose of SGLT-2 inhibitors- N/A SGLT-2 instructions:   Blood Thinner Instructions: Aspirin Instructions: To hold it after: 09/20/23 Last Dose:  Activity level: Can go up a flight of stairs and activities of daily living without stopping and without chest pain and/or shortness of breath   Able to exercise without chest pain and/or shortness of breath  Anesthesia review: Hx: HTN,Seizures (last one on: 1958),Increase cardiac score.  Patient denies shortness of breath, fever, cough and chest pain at PAT appointment   Patient verbalized understanding of instructions that were reviewed over the telephone.

## 2023-09-14 NOTE — Progress Notes (Signed)
 Lab. Results: Hemoglobin: 9.5. HCT: 29.4, Cr. : 2.6

## 2023-09-18 NOTE — Progress Notes (Signed)
 Anesthesia Chart Review   Case: [id number] Date/Time: 09/26/23 0715   Procedure: BIOPSY, PENIS - PENILE EXCISIONAL BIOPSY   Anesthesia type: General   Diagnosis: Penile lesion [N48.9]   Pre-op diagnosis: PENILE LESION   Location: [facility] PROCEDURE [facility] / [facility]   Surgeons: Shane Steffan BROCKS, MD       DISCUSSION:87 y.o. never smoker with h/o HTN, GERD, CKD Stage IV per notes baseline creatinine around 2.1, prostate cancer, penile lesion scheduled for above procedure 09/26/2023 with Dr. Steffan Shane.   Pt evaluated by cardiology in 2022 due to elevated ct cardiac scoring.  He then had a low risk treadmill test 03/25/2021.  Advised to follow up with cardiology prn. Pt reports to PAT nurse she can climb a flight of stairs with difficulty, no shortness or breath or chest pain.   VS: BP (!) 141/76   Pulse 65   Temp 36.7 C (Oral)   Ht 5' 6.5 (1.689 m)   Wt 64 kg   SpO2 98%   BMI 22.42 kg/m   PROVIDERS: Clarice Nottingham, MD is PCP    LABS: Labs reviewed: Acceptable for surgery. (all labs ordered are listed, but only abnormal results are displayed)  Labs Reviewed  BASIC METABOLIC PANEL WITH GFR - Abnormal; Notable for the following components:      Result Value   BUN 51 (*)    Creatinine, Ser 2.50 (*)    GFR, Estimated 25 (*)    All other components within normal limits  CBC - Abnormal; Notable for the following components:   RBC 2.87 (*)    Hemoglobin 9.5 (*)    HCT 29.4 (*)    MCV 102.4 (*)    All other components within normal limits     IMAGES:   EKG:   CV:  Past Medical History:  Diagnosis Date   Anemia    Asthma    Chronic kidney disease    stage 3b- 4   Dyslipidemia    GERD (gastroesophageal reflux disease)    History of idiopathic seizure    per pt age 50 or 62 had 2 seizure's , unknown cause, and no seizure's since   History of kidney stones    Hypertension    Prostate cancer ([facility]) UROLOGIST-  DR DAHLSTEDT/  ONCOLOGIST-  DR MANNING/ DR AMADEO   dx  08-10-2016 (bx)  Stage T2a,  Gleason 4+5,  PSA 6.1,  vol 38.85cc--- plan external beam radiation    Past Surgical History:  Procedure Laterality Date   CATARACT EXTRACTION W/ INTRAOCULAR LENS IMPLANT Right 2015   COLONOSCOPY  last one 2017   EYE SURGERY Right 2010   removal epiretinal membrane   GOLD SEED IMPLANT N/A 12/09/2016   Procedure: GOLD SEED IMPLANT;  Surgeon: Matilda Senior, MD;  Location: [facility];  Service: Urology;  Laterality: N/A;   INGUINAL HERNIA REPAIR Bilateral 1980s;  1990   SPACE OAR INSTILLATION N/A 12/09/2016   Procedure: SPACE OAR INSTILLATION;  Surgeon: Matilda Senior, MD;  Location: [facility];  Service: Urology;  Laterality: N/A;   TONSILLECTOMY  child    MEDICATIONS:  amLODipine (NORVASC) 10 MG tablet   aspirin EC 81 MG tablet   calcium carbonate (TUMS - DOSED IN MG ELEMENTAL CALCIUM) 500 MG chewable tablet   cholecalciferol (VITAMIN D3) 25 MCG (1000 UNIT) tablet   cyanocobalamin  (VITAMIN B12) 1000 MCG tablet   Ferrous Sulfate (IRON PO)   fluorouracil (EFUDEX) 5 % cream   folic acid  (FOLVITE )  800 MCG tablet   fosinopril (MONOPRIL) 40 MG tablet   furosemide (LASIX) 20 MG tablet   JARDIANCE 10 MG TABS tablet   LORazepam (ATIVAN) 1 MG tablet   MINOXIDIL EX   Multiple Vitamin (MULTIVITAMIN WITH MINERALS) TABS tablet   PARoxetine (PAXIL) 20 MG tablet   rosuvastatin (CRESTOR) 40 MG tablet   Turmeric 500 MG CAPS   Vibegron (GEMTESA) 75 MG TABS   Zinc 15 MG CAPS   No current facility-administered medications for this encounter.      Harlene Hoots Ward, PA-C [facility] Pre-Surgical Testing [phone]

## 2023-09-24 NOTE — H&P (Signed)
 88 year old male diagnosed with grade group 5 prostate cancer in 2018. Gleason score 4+5 in the high score. He underwent Firmagon  and then Eligard  completed 40 treatments of external beam radiation 2018. Completed 6 months of ADT. Subsequent symptoms include urgency and frequency.   PMH: HLD, GERD, hypertension, prostate cancer   Prostate cancer:  08/30/2023: PSA undetectable, no wt loss no changes in appetitie.   Urinary urgency:  08/30/2023: PVR 19, frequency and urgency, no medications for frequency and urgency, no GH, noUTI.   Lesion on Penis: on pts right side, erytehamtous around a CM in diameter and small white papule in middle, not tender has been present for 4 months  09/26/23: pt here today for penile biopsy concenring for zoon's balanitis     ALLERGIES: Nuts    MEDICATIONS: Aspirin  AmLODIPine Besylate 5 MG Oral Tablet Oral  Centrum Silver TABS Oral  Famotidine PRN  Farxiga  Fosinopril Sodium TABS 60mg   LORazepam 0.5 MG Tablet 0 Oral PRN  Paxil 20 MG Oral Tablet Oral  Rosuvastatin Calcium 40 MG Tablet 1 tablet PO Daily  Zinc TABS Oral     GU PSH: Locm 300-399Mg /Ml Iodine,1Ml - 2018 PLACE RT DEVICE/MARKER, PROS - 2018 Prostate Needle Biopsy - 2018 Transperineal Plmt Biodegradable Matrl 1/Mlt Njx - 2018       PSH Notes: Dermatological Surgery, Complete Colonoscopy, Cataract Surgery, Femur Repair, Hernia Repair, Hernia Repair, Eye Surgery, Grafts Bone   nasal skin removal squamous cell.   NON-GU PSH: Diagnostic Colonoscopy - 2014 Hernia Repair - 2013, 2013 Surgical Pathology, Gross And Microscopic Examination For Prostate Needle - 2018     GU PMH: Chronic Kidney Disease, Followed by Dr. Jerrye - 08/24/2022 History of prostate cancer, Grade group 5 prostate cancer, status post LT ADT and EBRT with excellent durable response - 08/24/2022, History of high risk prostate cancer, status post LT ADT as well as external beam radiotherapy with excellent persistent PSA  response. He does have lower urinary tract symptoms but these are fairly tolerable, - 09/10/2021 Overactive bladder, Bothersome - 08/24/2022, - 09/10/2021 ED due to arterial insufficiency - 09/10/2021 Urge incontinence - 09/10/2021 Unil Inguinal Hernia W/O obst or gang,non-recurrent (Stable) - 2018, Inguinal hernia, unilateral, - 2014 Prostate Cancer      PMH Notes:  2011-09-19 13:14:18 - Note: Seizure  2011-09-19 13:14:18 - Note: Skin Cancer   NON-GU PMH: Encounter for general adult medical examination without abnormal findings, Encounter for preventive health examination - 2015 Anxiety, Anxiety (Symptom) - 2014 Asthma, Asthma - 2014 Cardiac murmur, unspecified, Murmurs - 2014 Personal history of other diseases of the circulatory system, History of hypertension - 2014 Personal history of other diseases of the digestive system, History of esophageal reflux - 2014 Personal history of other diseases of the nervous system and sense organs, History of glaucoma - 2014 Skin Cancer, History    FAMILY HISTORY: Death In The Family Father - Father Death In The Family Mother - Mother Family Health Status Number - Runs In Family Hypertension - Father Prostate Cancer - Father pulmonary embolism - Father   SOCIAL HISTORY: Marital Status: Married Preferred Language: English; Ethnicity: Not Hispanic Or Latino; Race: White Current Smoking Status: Patient has never smoked.   Tobacco Use Assessment Completed: Used Tobacco in last 30 days? Does not use smokeless tobacco. Drinks 3 drinks per week.  Does not use drugs. Drinks 1 caffeinated drink per day. Has not had a blood transfusion.    REVIEW OF SYSTEMS:    GU Review Male:  Patient denies frequent urination, hard to postpone urination, burning/ pain with urination, get up at night to urinate, leakage of urine, stream starts and stops, trouble starting your stream, have to strain to urinate , erection problems, and penile pain.  Gastrointestinal  (Upper):   Patient denies nausea, vomiting, and indigestion/ heartburn.  Gastrointestinal (Lower):   Patient denies diarrhea and constipation.  Constitutional:   Patient denies fever, night sweats, weight loss, and fatigue.  Skin:   Patient denies skin rash/ lesion and itching.  Eyes:   Patient denies blurred vision and double vision.  Ears/ Nose/ Throat:   Patient denies sore throat and sinus problems.  Hematologic/Lymphatic:   Patient denies swollen glands and easy bruising.  Cardiovascular:   Patient denies leg swelling and chest pains.  Respiratory:   Patient denies cough and shortness of breath.  Endocrine:   Patient denies excessive thirst.  Musculoskeletal:   Patient denies back pain and joint pain.  Neurological:   Patient denies dizziness and headaches.  Psychologic:   Patient denies depression and anxiety.   VITAL SIGNS: None   GU PHYSICAL EXAMINATION:    Penis: Circumcised penis, 1 cm erythematous plaque that is nontender has some raised white spots on this no purulent drainage not weeping defined borders. Concern for zoon's balanitis.SABRA    MULTI-SYSTEM PHYSICAL EXAMINATION:       Complexity of Data:  Source Of History:  Patient  Records Review:   Previous Patient Records  Urine Test Review:   Urinalysis  Urodynamics Review:   Review Bladder Scan   08/23/23 08/17/22 03/01/22 09/03/21 03/23/21 09/01/20 02/19/20 08/21/19  PSA  Total PSA <0.015 ng/mL <0.015 ng/mL <0.015 ng/mL <0.015 ng/mL <0.015 ng/mL <0.015 ng/mL <0.015 ng/mL <0.015 ng/mL    09/01/20 08/21/19 02/06/19 08/09/18 03/19/18 10/31/17 06/19/17  Hormones  Testosterone , Total 83.5 ng/dL 821.8 ng/dL 38.9 ng/dL <89 ng/dL <89 ng/dL <89 ng/dL <89 ng/dL    PROCEDURES:         PVR Ultrasound - 48201  Scanned Volume: 19 cc         Visit Complexity - G2211          Urinalysis Dipstick Dipstick Cont'd  Color: Yellow Bilirubin: Neg mg/dL  Appearance: Clear Ketones: Neg mg/dL  Specific Gravity: <=8.994 Blood: Neg  ery/uL  pH: <=5.0 Protein: Trace mg/dL  Glucose: Trace mg/dL Urobilinogen: 0.2 mg/dL    Nitrites: Neg    Leukocyte Esterase: Neg leu/uL    ASSESSMENT:      ICD-10 Details  1 GU:   ED due to arterial insufficiency - N52.01 Chronic, Stable  2   History of prostate cancer - Z85.46 Chronic, Stable  3   Overactive bladder - N32.81 Chronic, Stable  4   Disorder of Penis Ot - N48.89 Undiagnosed New Problem   PLAN:            Medications New Meds: Gemtesa 75 MG Tablet 1 tablet PO Daily NCPDP [id number]  #30  6 Refill(s)  Pharmacy Name:  [facility]  Address:  [address]   [place], [place] [address]  Phone:  [phone]  Fax:  [phone]            Orders Labs Hgb-A1c, Hemoglobin and Hematocrit, BMP          Schedule Return Visit/Planned Activity: 3 Months - Office Visit          Document Letter(s):  Created for Patient: Clinical Summary         Notes:  Penile lesion: Red flat plaque on exam concern presents balanitis will need penile biopsy we discussed risk benefits alternatives procedure including bleeding infection damage chronic structures possible scarring of the penis, possible altered appearance of the penis possible risk of infection. Patient voiced understanding and consent was obtained.  Penile biopsy planned for today

## 2023-09-26 ENCOUNTER — Other Ambulatory Visit: Payer: Self-pay

## 2023-09-26 ENCOUNTER — Ambulatory Visit (HOSPITAL_COMMUNITY): Payer: Self-pay | Admitting: Physician Assistant

## 2023-09-26 ENCOUNTER — Ambulatory Visit (HOSPITAL_COMMUNITY)
Admission: RE | Admit: 2023-09-26 | Discharge: 2023-09-26 | Disposition: A | Discharge status: Home / Self Care | Attending: Urology | Admitting: Urology

## 2023-09-26 ENCOUNTER — Encounter (HOSPITAL_COMMUNITY)
Admission: RE | Disposition: A | Discharge status: Home / Self Care | Payer: Self-pay | Source: Home / Self Care | Attending: Urology

## 2023-09-26 ENCOUNTER — Ambulatory Visit (HOSPITAL_COMMUNITY): Payer: Self-pay

## 2023-09-26 ENCOUNTER — Encounter (HOSPITAL_COMMUNITY): Payer: Self-pay | Admitting: Urology

## 2023-09-26 DIAGNOSIS — D074 Carcinoma in situ of penis: Secondary | ICD-10-CM | POA: Diagnosis not present

## 2023-09-26 DIAGNOSIS — I2510 Atherosclerotic heart disease of native coronary artery without angina pectoris: Secondary | ICD-10-CM | POA: Diagnosis not present

## 2023-09-26 DIAGNOSIS — Z8249 Family history of ischemic heart disease and other diseases of the circulatory system: Secondary | ICD-10-CM | POA: Diagnosis not present

## 2023-09-26 DIAGNOSIS — N5201 Erectile dysfunction due to arterial insufficiency: Secondary | ICD-10-CM | POA: Diagnosis not present

## 2023-09-26 DIAGNOSIS — Z79899 Other long term (current) drug therapy: Secondary | ICD-10-CM | POA: Diagnosis not present

## 2023-09-26 DIAGNOSIS — N3281 Overactive bladder: Secondary | ICD-10-CM | POA: Diagnosis not present

## 2023-09-26 DIAGNOSIS — I10 Essential (primary) hypertension: Secondary | ICD-10-CM | POA: Diagnosis not present

## 2023-09-26 DIAGNOSIS — N4889 Other specified disorders of penis: Secondary | ICD-10-CM | POA: Diagnosis not present

## 2023-09-26 DIAGNOSIS — Z8546 Personal history of malignant neoplasm of prostate: Secondary | ICD-10-CM | POA: Diagnosis not present

## 2023-09-26 DIAGNOSIS — N489 Disorder of penis, unspecified: Secondary | ICD-10-CM

## 2023-09-26 DIAGNOSIS — K219 Gastro-esophageal reflux disease without esophagitis: Secondary | ICD-10-CM | POA: Insufficient documentation

## 2023-09-26 HISTORY — PX: PENILE BIOPSY: SHX6013

## 2023-09-26 SURGERY — BIOPSY, PENIS
Anesthesia: General

## 2023-09-26 MED ORDER — LIDOCAINE HCL (PF) 1 % IJ SOLN
INTRAMUSCULAR | Status: DC | PRN
  Administered 2023-09-26: 20 mL

## 2023-09-26 MED ORDER — FENTANYL CITRATE (PF) 100 MCG/2ML IJ SOLN
INTRAMUSCULAR | Status: DC | PRN
  Administered 2023-09-26: 25 ug via INTRAVENOUS

## 2023-09-26 MED ORDER — OXYCODONE HCL 5 MG PO TABS: 5.0000 mg | ORAL_TABLET | Freq: Four times a day (QID) | ORAL | 0 refills | Status: AC | PRN

## 2023-09-26 MED ORDER — TRIPLE ANTIBIOTIC 3.5-400-5000 EX OINT
TOPICAL_OINTMENT | CUTANEOUS | Status: DC | PRN
  Administered 2023-09-26: 1

## 2023-09-26 MED ORDER — EPHEDRINE 5 MG/ML INJ
INTRAVENOUS | Status: AC
  Filled 2023-09-26: qty 5

## 2023-09-26 MED ORDER — LIDOCAINE HCL (CARDIAC) PF 100 MG/5ML IV SOSY
PREFILLED_SYRINGE | INTRAVENOUS | Status: DC | PRN
  Administered 2023-09-26: 60 mg via INTRAVENOUS

## 2023-09-26 MED ORDER — FENTANYL CITRATE PF 50 MCG/ML IJ SOSY: 25.0000 ug | PREFILLED_SYRINGE | INTRAMUSCULAR | Status: DC | PRN

## 2023-09-26 MED ORDER — PROPOFOL 10 MG/ML IV BOLUS
INTRAVENOUS | Status: DC | PRN
  Administered 2023-09-26: 120 mg via INTRAVENOUS

## 2023-09-26 MED ORDER — LACTATED RINGERS IV SOLN: INTRAVENOUS | Status: DC

## 2023-09-26 MED ORDER — PHENYLEPHRINE 80 MCG/ML (10ML) SYRINGE FOR IV PUSH (FOR BLOOD PRESSURE SUPPORT)
PREFILLED_SYRINGE | INTRAVENOUS | Status: AC
  Filled 2023-09-26: qty 10

## 2023-09-26 MED ORDER — PHENYLEPHRINE HCL (PRESSORS) 10 MG/ML IV SOLN
INTRAVENOUS | Status: DC | PRN
  Administered 2023-09-26: 160 ug via INTRAVENOUS
  Administered 2023-09-26: 80 ug via INTRAVENOUS

## 2023-09-26 MED ORDER — FENTANYL CITRATE (PF) 100 MCG/2ML IJ SOLN
INTRAMUSCULAR | Status: AC
  Filled 2023-09-26: qty 2

## 2023-09-26 MED ORDER — DEXAMETHASONE SODIUM PHOSPHATE 4 MG/ML IJ SOLN
INTRAMUSCULAR | Status: DC | PRN
  Administered 2023-09-26: 5 mg via INTRAVENOUS

## 2023-09-26 MED ORDER — EPHEDRINE SULFATE (PRESSORS) 50 MG/ML IJ SOLN
INTRAMUSCULAR | Status: DC | PRN
Start: 2023-09-26 — End: 2023-09-26
  Administered 2023-09-26: 5 mg via INTRAVENOUS
  Administered 2023-09-26: 10 mg via INTRAVENOUS
  Administered 2023-09-26 (×2): 5 mg via INTRAVENOUS

## 2023-09-26 MED ORDER — POLYETHYLENE GLYCOL 3350 17 G PO PACK: 17.0000 g | PACK | Freq: Every day | ORAL | 0 refills | Status: AC

## 2023-09-26 MED ORDER — ACETAMINOPHEN 10 MG/ML IV SOLN: 1000.0000 mg | Freq: Once | INTRAVENOUS | Status: DC | PRN

## 2023-09-26 MED ORDER — ONDANSETRON HCL 4 MG/2ML IJ SOLN: 4.0000 mg | Freq: Once | INTRAMUSCULAR | Status: DC | PRN

## 2023-09-26 MED ORDER — 0.9 % SODIUM CHLORIDE (POUR BTL) OPTIME
TOPICAL | Status: DC | PRN
  Administered 2023-09-26: 1000 mL

## 2023-09-26 MED ORDER — CHLORHEXIDINE GLUCONATE 0.12 % MT SOLN
15.0000 mL | Freq: Once | OROMUCOSAL | Status: AC
  Administered 2023-09-26: 15 mL via OROMUCOSAL

## 2023-09-26 MED ORDER — CEFAZOLIN SODIUM-DEXTROSE 2-4 GM/100ML-% IV SOLN
2.0000 g | INTRAVENOUS | Status: AC
  Administered 2023-09-26: 2 g via INTRAVENOUS
  Filled 2023-09-26: qty 100

## 2023-09-26 MED ORDER — DEXAMETHASONE SODIUM PHOSPHATE 10 MG/ML IJ SOLN
INTRAMUSCULAR | Status: AC
  Filled 2023-09-26: qty 1

## 2023-09-26 MED ORDER — ORAL CARE MOUTH RINSE: 15.0000 mL | Freq: Once | OROMUCOSAL | Status: AC

## 2023-09-26 MED ORDER — BACITRACIN-NEOMYCIN-POLYMYXIN OINTMENT TUBE
TOPICAL_OINTMENT | CUTANEOUS | Status: AC
  Filled 2023-09-26: qty 14.17

## 2023-09-26 MED ORDER — PROPOFOL 10 MG/ML IV BOLUS
INTRAVENOUS | Status: AC
  Filled 2023-09-26: qty 20

## 2023-09-26 MED ORDER — LIDOCAINE HCL 1 % IJ SOLN
INTRAMUSCULAR | Status: AC
  Filled 2023-09-26: qty 20

## 2023-09-26 MED ORDER — ESMOLOL HCL 100 MG/10ML IV SOLN
INTRAVENOUS | Status: AC
  Filled 2023-09-26: qty 10

## 2023-09-26 MED ORDER — GLYCOPYRROLATE 0.2 MG/ML IJ SOLN
INTRAMUSCULAR | Status: AC
  Filled 2023-09-26: qty 1

## 2023-09-26 MED ORDER — ONDANSETRON HCL 4 MG/2ML IJ SOLN
INTRAMUSCULAR | Status: AC
Start: 2023-09-26 — End: 2023-09-26
  Filled 2023-09-26: qty 2

## 2023-09-26 MED ORDER — ONDANSETRON HCL 4 MG/2ML IJ SOLN
INTRAMUSCULAR | Status: DC | PRN
Start: 2023-09-26 — End: 2023-09-26
  Administered 2023-09-26: 4 mg via INTRAVENOUS

## 2023-09-26 MED ORDER — LIDOCAINE HCL (PF) 2 % IJ SOLN
INTRAMUSCULAR | Status: AC
  Filled 2023-09-26: qty 5

## 2023-09-26 SURGICAL SUPPLY — 32 items
BAG COUNTER SPONGE SURGICOUNT (BAG) IMPLANT
BLADE SURG 15 STRL LF DISP TIS (BLADE) ×1 IMPLANT
BNDG COHESIVE 2X5 TAN ST LF (GAUZE/BANDAGES/DRESSINGS) ×1 IMPLANT
COVER SURGICAL LIGHT HANDLE (MISCELLANEOUS) ×1 IMPLANT
DRAPE LAPAROTOMY T 98X78 PEDS (DRAPES) ×1 IMPLANT
ELECT REM PT RETURN 15FT ADLT (MISCELLANEOUS) ×1 IMPLANT
GAUZE 4X4 16PLY ~~LOC~~+RFID DBL (SPONGE) ×1 IMPLANT
GAUZE PETROLATUM 1 X8 (GAUZE/BANDAGES/DRESSINGS) ×1 IMPLANT
GAUZE STRETCH 2X75IN STRL (MISCELLANEOUS) ×1 IMPLANT
GAUZE XEROFORM 1X8 LF (GAUZE/BANDAGES/DRESSINGS) IMPLANT
GLOVE BIO SURGEON STRL SZ8 (GLOVE) ×1 IMPLANT
GLOVE BIOGEL PI IND STRL 8.5 (GLOVE) ×1 IMPLANT
GOWN STRL REUS W/ TWL XL LVL3 (GOWN DISPOSABLE) ×1 IMPLANT
KIT BASIN OR (CUSTOM PROCEDURE TRAY) ×1 IMPLANT
KIT TURNOVER KIT A (KITS) ×1 IMPLANT
NDL HYPO 22X1.5 SAFETY MO (MISCELLANEOUS) ×1 IMPLANT
NEEDLE HYPO 22X1.5 SAFETY MO (MISCELLANEOUS) ×1 IMPLANT
NS IRRIG 1000ML POUR BTL (IV SOLUTION) IMPLANT
PACK BASIC VI WITH GOWN DISP (CUSTOM PROCEDURE TRAY) ×1 IMPLANT
PENCIL SMOKE EVACUATOR (MISCELLANEOUS) ×1 IMPLANT
SOL PREP POV-IOD 4OZ 10% (MISCELLANEOUS) ×1 IMPLANT
SUT CHROMIC 3 0 SH 27 (SUTURE) IMPLANT
SUT CHROMIC 4 0 SH 27 (SUTURE) IMPLANT
SUT MNCRL AB 4-0 PS2 18 (SUTURE) IMPLANT
SUT PROLENE 2 0 SH DA (SUTURE) IMPLANT
SUT VIC AB 3-0 SH 18 (SUTURE) IMPLANT
SUT VIC AB 3-0 SH 27XBRD (SUTURE) IMPLANT
SUT VIC AB 4-0 SH 18 (SUTURE) IMPLANT
SYR CONTROL 10ML LL (SYRINGE) ×1 IMPLANT
TOWEL OR 17X26 10 PK STRL BLUE (TOWEL DISPOSABLE) ×1 IMPLANT
WATER STERILE IRR 1000ML POUR (IV SOLUTION) IMPLANT
YANKAUER SUCT BULB TIP 10FT TU (MISCELLANEOUS) ×1 IMPLANT

## 2023-09-26 NOTE — Transfer of Care (Cosign Needed)
 Immediate Anesthesia Transfer of Care Note  Patient: Darrell Hernandez.  Procedure(s) Performed: BIOPSY, PENIS  Patient Location: PACU  Anesthesia Type:General  Level of Consciousness: awake, alert , and patient cooperative  Airway & Oxygen Therapy: Patient Spontanous Breathing and Patient connected to face mask oxygen  Post-op Assessment: Report given to RN and Post -op Vital signs reviewed and stable  Post vital signs: Reviewed and stable  Last Vitals:  Vitals Value Taken Time  BP 130/85 09/26/23 08:15  Temp    Pulse 66 09/26/23 08:18  Resp 15 09/26/23 08:18  SpO2 100 % 09/26/23 08:13  Vitals shown include unfiled device data.  Last Pain:  Vitals:   09/26/23 0536  TempSrc: Oral         Complications: No notable events documented.

## 2023-09-26 NOTE — Anesthesia Procedure Notes (Cosign Needed)
 Procedure Name: Intubation Date/Time: 09/26/2023 7:32 AM  Performed by: Delayne Jacques SQUIBB, RNPre-anesthesia Checklist: Patient identified, Emergency Drugs available, Suction available and Patient being monitored Patient Re-evaluated:Patient Re-evaluated prior to induction Oxygen Delivery Method: Circle system utilized Preoxygenation: Pre-oxygenation with 100% oxygen Induction Type: IV induction LMA: LMA inserted LMA Size: 5.0 Tube type: Oral Number of attempts: 1 Tube secured with: Tape Dental Injury: Teeth and Oropharynx as per pre-operative assessment  Comments: No complications

## 2023-09-26 NOTE — Discharge Instructions (Signed)
 Postoperative instructions for Penile Surgery   Wound:  In most cases your incision will have absorbable sutures that run along the course of your incision and will dissolve within the first 10-20 days. Some will fall out even earlier. Expect some redness as the sutures dissolved but this should occur only around the sutures. If there is generalized redness, especially with increasing pain or swelling, let us  know. The penis will very likely get black and blue as the blood in the tissues spread. Sometimes the whole penis will turn colors. The black and blue is followed by a yellow and brown color. In time, all the discoloration will go away.  Diet:  You may return to your normal diet within 24 hours following your surgery. You may note some mild nausea and possibly vomiting the first 6-8 hours following surgery. This is usually due to the side effects of anesthesia, and will disappear quite soon. I would suggest clear liquids and a very light meal the first evening following your surgery.  Activity:  Your physical activity should be restricted the first 48 hours.. During the first 7-10 days following surgery he should avoid lifting any heavy objects (anything greater than 15 pounds), and avoid strenuous exercise. If you work, ask us  specifically about your restrictions, both for work and home. We will write a note to your employer if needed.  Avoid all sexual activity for 6 weeks   Ice packs can be placed on and off over the penis for the first 48 hours to help relieve the pain and keep the swelling down. Frozen peas or corn in a ZipLock bag can be frozen, used and re-frozen. Fifteen minutes on and 15 minutes off is a reasonable schedule.   Dressing: - you can take the dressing off 24 hours after surgery   Hygiene:  You may shower 48 hours after your surgery. Do not submerge your incision underwater for 2 weeks after surgery this includes baths, hot tubs, and swimming.   Medication:  You  will be sent home with some type of pain medication. In many cases you will be sent home with a narcotic pain pill (Vicodin or Tylox). If the pain is not too bad, you may take either Tylenol  (acetaminophen ) or Advil (ibuprofen) which contain no narcotic agents, and might be tolerated a little better, with fewer side effects. If the pain medication you are sent home with does not control the pain, you will have to let us  know. Some narcotic pain medications cannot be given or refilled by a phone call to a pharmacy.  Problems you should report to us :  Fever of 101.0 degrees Fahrenheit or greater. Moderate or severe swelling under the skin incision or involving the scrotum. Drug reaction such as hives, a rash, nausea or vomiting.

## 2023-09-26 NOTE — Op Note (Signed)
 Preoperative diagnosis:  Penile lesion 1.5 cm  Postoperative diagnosis:  Same  Procedure:  1.5 cm excisional penile biopsy  Surgeon: Steffan Pea  Anesthesia: General  Complications: None  Intraoperative findings: 1.5 cm erythematous lesion just below the corona on the ventral right aspect of the penis  EBL: Minimal  Specimens: None  Indication: Darrell Hernandez. is a 88 y.o. patient with a penile lesion that has not resolved for the past 5 months it is erythematous and slightly raised plaque he is here today for biopsy.  After reviewing the management options for treatment, he elected to proceed with the above surgical procedure(s). We have discussed the potential benefits and risks of the procedure, side effects of the proposed treatment, the likelihood of the patient achieving the goals of the procedure, and any potential problems that might occur during the procedure or recuperation. Informed consent has been obtained.  Procedure:  After general anesthesia was induced the patient was prepped and draped in the routine sterile fashion. A penile block was then performed with local anesthetic.   A 2-0 Prolene suture was used as a glans stitch to put the penis on stretch.  Once this was done the ventral aspect of the penis was exposed.  The 1.5 cm lesion was outlined.  A 15 blade was used to circumferentially cut the skin around the lesion.  The lesion was then pulled up and the base was cut using the Bovie.  Making sure to stay away from the deeper structures.  Once the specimen was removed spot cautery was done to the bleeding spots at the base of the resection.  The skin was then closed with a running 4-0 Monocryl.  Vaseline impregnated dressing was then applied to the suture line in the penis was wrapped with Kling dressing gently and Coban.  The patient was subsequently awoken and returned to PACU in excellent condition. There no immediate complications.

## 2023-09-26 NOTE — Anesthesia Preprocedure Evaluation (Signed)
 Anesthesia Evaluation  Patient identified by MRN, date of birth, ID band Patient awake    Reviewed: Allergy & Precautions, NPO status , Patient's Chart, lab work & pertinent test results  History of Anesthesia Complications Negative for: history of anesthetic complications  Airway Mallampati: III  TM Distance: >3 FB Neck ROM: Full    Dental no notable dental hx. (+) Teeth Intact   Pulmonary neg pulmonary ROS, neg sleep apnea, neg COPD, Patient abstained from smoking.Not current smoker   Pulmonary exam normal breath sounds clear to auscultation       Cardiovascular Exercise Tolerance: Good METShypertension, Pt. on medications + CAD  (-) Past MI (-) dysrhythmias  Rhythm:Regular Rate:Normal - Systolic murmurs    Neuro/Psych negative neurological ROS  negative psych ROS   GI/Hepatic ,GERD  Medicated and Controlled,,(+)     (-) substance abuse    Endo/Other  neg diabetes    Renal/GU CRFRenal disease     Musculoskeletal   Abdominal   Peds  Hematology   Anesthesia Other Findings Past Medical History: No date: Anemia No date: Asthma No date: Chronic kidney disease     Comment:  stage 3b- 4 No date: Dyslipidemia No date: GERD (gastroesophageal reflux disease) No date: History of idiopathic seizure     Comment:  per pt age 75 or 22 had 2 seizure's , unknown cause, and              no seizure's since No date: History of kidney stones No date: Hypertension UROLOGIST-  DR DAHLSTEDT/  ONCOLOGIST-  DR MANNING/ DR AMADEO:  Prostate cancer [facility])     Comment:  dx 08-10-2016 (bx)  Stage T2a,  Gleason 4+5,  PSA 6.1,                vol 38.85cc--- plan external beam radiation  Reproductive/Obstetrics                              Anesthesia Physical Anesthesia Plan  ASA: 2  Anesthesia Plan: General   Post-op Pain Management:    Induction: Intravenous  PONV Risk Score and Plan: 3 and  Ondansetron , Dexamethasone  and Treatment may vary due to age or medical condition  Airway Management Planned: LMA  Additional Equipment: None  Intra-op Plan:   Post-operative Plan: Extubation in OR  Informed Consent: I have reviewed the patients History and Physical, chart, labs and discussed the procedure including the risks, benefits and alternatives for the proposed anesthesia with the patient or authorized representative who has indicated his/her understanding and acceptance.     Dental advisory given  Plan Discussed with: CRNA and Surgeon  Anesthesia Plan Comments: (Discussed risks of anesthesia with patient, including PONV, sore throat, lip/dental/eye damage, post operative cognitive dysfunction. Rare risks discussed as well, such as cardiorespiratory and neurological sequelae, and allergic reactions. Discussed the role of CRNA in patient's perioperative care. Patient understands.)        Anesthesia Quick Evaluation

## 2023-09-26 NOTE — Anesthesia Postprocedure Evaluation (Signed)
 Anesthesia Post Note  Patient: Darrell Hernandez.  Procedure(s) Performed: BIOPSY, PENIS     Patient location during evaluation: PACU Anesthesia Type: General Level of consciousness: awake and alert Pain management: pain level controlled Vital Signs Assessment: post-procedure vital signs reviewed and stable Respiratory status: spontaneous breathing, nonlabored ventilation, respiratory function stable and patient connected to nasal cannula oxygen Cardiovascular status: blood pressure returned to baseline and stable Postop Assessment: no apparent nausea or vomiting Anesthetic complications: no   No notable events documented.  Last Vitals:  Vitals:   09/26/23 0855 09/26/23 0915  BP: (!) 151/77 137/78  Pulse: 64 62  Resp: 20 20  Temp: 36.6 C 36.6 C  SpO2: 94% 94%    Last Pain:  Vitals:   09/26/23 0915  TempSrc: Oral  PainSc: 0-No pain                 Rome Ade

## 2023-09-27 ENCOUNTER — Encounter (HOSPITAL_COMMUNITY): Payer: Self-pay | Admitting: Urology

## 2023-09-27 LAB — SURGICAL PATHOLOGY

## 2023-10-05 DIAGNOSIS — N1832 Chronic kidney disease, stage 3b: Secondary | ICD-10-CM | POA: Diagnosis not present

## 2023-10-10 DIAGNOSIS — Z961 Presence of intraocular lens: Secondary | ICD-10-CM | POA: Diagnosis not present

## 2023-10-10 DIAGNOSIS — H35371 Puckering of macula, right eye: Secondary | ICD-10-CM | POA: Diagnosis not present

## 2023-10-10 DIAGNOSIS — H34831 Tributary (branch) retinal vein occlusion, right eye, with macular edema: Secondary | ICD-10-CM | POA: Diagnosis not present

## 2023-10-11 DIAGNOSIS — D074 Carcinoma in situ of penis: Secondary | ICD-10-CM | POA: Diagnosis not present

## 2023-10-13 DIAGNOSIS — I10 Essential (primary) hypertension: Secondary | ICD-10-CM | POA: Diagnosis not present

## 2023-10-13 DIAGNOSIS — N184 Chronic kidney disease, stage 4 (severe): Secondary | ICD-10-CM | POA: Diagnosis not present

## 2023-10-13 DIAGNOSIS — D631 Anemia in chronic kidney disease: Secondary | ICD-10-CM | POA: Diagnosis not present

## 2023-10-13 DIAGNOSIS — N051 Unspecified nephritic syndrome with focal and segmental glomerular lesions: Secondary | ICD-10-CM | POA: Diagnosis not present

## 2023-11-07 DIAGNOSIS — D692 Other nonthrombocytopenic purpura: Secondary | ICD-10-CM | POA: Diagnosis not present

## 2023-11-07 DIAGNOSIS — C44519 Basal cell carcinoma of skin of other part of trunk: Secondary | ICD-10-CM | POA: Diagnosis not present

## 2023-11-07 DIAGNOSIS — L821 Other seborrheic keratosis: Secondary | ICD-10-CM | POA: Diagnosis not present

## 2023-11-07 DIAGNOSIS — Z85828 Personal history of other malignant neoplasm of skin: Secondary | ICD-10-CM | POA: Diagnosis not present

## 2023-11-07 DIAGNOSIS — L820 Inflamed seborrheic keratosis: Secondary | ICD-10-CM | POA: Diagnosis not present

## 2023-11-07 DIAGNOSIS — L814 Other melanin hyperpigmentation: Secondary | ICD-10-CM | POA: Diagnosis not present

## 2023-11-07 DIAGNOSIS — L570 Actinic keratosis: Secondary | ICD-10-CM | POA: Diagnosis not present

## 2024-01-09 DIAGNOSIS — C44722 Squamous cell carcinoma of skin of right lower limb, including hip: Secondary | ICD-10-CM | POA: Diagnosis not present

## 2024-01-18 DIAGNOSIS — N184 Chronic kidney disease, stage 4 (severe): Secondary | ICD-10-CM | POA: Diagnosis not present

## 2024-01-26 DIAGNOSIS — D631 Anemia in chronic kidney disease: Secondary | ICD-10-CM | POA: Diagnosis not present

## 2024-01-26 DIAGNOSIS — I10 Essential (primary) hypertension: Secondary | ICD-10-CM | POA: Diagnosis not present

## 2024-01-26 DIAGNOSIS — N184 Chronic kidney disease, stage 4 (severe): Secondary | ICD-10-CM | POA: Diagnosis not present

## 2024-01-26 DIAGNOSIS — N051 Unspecified nephritic syndrome with focal and segmental glomerular lesions: Secondary | ICD-10-CM | POA: Diagnosis not present

## 2024-01-29 ENCOUNTER — Other Ambulatory Visit: Payer: Self-pay | Admitting: Nephrology

## 2024-01-29 DIAGNOSIS — N184 Chronic kidney disease, stage 4 (severe): Secondary | ICD-10-CM

## 2024-01-29 DIAGNOSIS — R350 Frequency of micturition: Secondary | ICD-10-CM | POA: Diagnosis not present

## 2024-01-29 DIAGNOSIS — Q6102 Congenital multiple renal cysts: Secondary | ICD-10-CM

## 2024-01-29 DIAGNOSIS — C602 Malignant neoplasm of body of penis: Secondary | ICD-10-CM | POA: Diagnosis not present

## 2024-01-29 DIAGNOSIS — R3915 Urgency of urination: Secondary | ICD-10-CM | POA: Diagnosis not present

## 2024-01-29 DIAGNOSIS — N3281 Overactive bladder: Secondary | ICD-10-CM | POA: Diagnosis not present

## 2024-02-13 DIAGNOSIS — Z85828 Personal history of other malignant neoplasm of skin: Secondary | ICD-10-CM | POA: Diagnosis not present

## 2024-02-13 DIAGNOSIS — L821 Other seborrheic keratosis: Secondary | ICD-10-CM | POA: Diagnosis not present

## 2024-02-13 DIAGNOSIS — L570 Actinic keratosis: Secondary | ICD-10-CM | POA: Diagnosis not present

## 2024-02-13 DIAGNOSIS — L814 Other melanin hyperpigmentation: Secondary | ICD-10-CM | POA: Diagnosis not present

## 2024-02-21 DIAGNOSIS — I10 Essential (primary) hypertension: Secondary | ICD-10-CM | POA: Diagnosis not present

## 2024-02-21 DIAGNOSIS — E7800 Pure hypercholesterolemia, unspecified: Secondary | ICD-10-CM | POA: Diagnosis not present

## 2024-02-21 DIAGNOSIS — R739 Hyperglycemia, unspecified: Secondary | ICD-10-CM | POA: Diagnosis not present

## 2024-03-18 ENCOUNTER — Other Ambulatory Visit

## 2024-03-18 DIAGNOSIS — Q6102 Congenital multiple renal cysts: Secondary | ICD-10-CM

## 2024-03-18 DIAGNOSIS — N184 Chronic kidney disease, stage 4 (severe): Secondary | ICD-10-CM

## 2024-03-24 ENCOUNTER — Emergency Department (HOSPITAL_BASED_OUTPATIENT_CLINIC_OR_DEPARTMENT_OTHER)

## 2024-03-24 ENCOUNTER — Other Ambulatory Visit: Payer: Self-pay

## 2024-03-24 ENCOUNTER — Emergency Department (HOSPITAL_BASED_OUTPATIENT_CLINIC_OR_DEPARTMENT_OTHER)
Admission: EM | Admit: 2024-03-24 | Discharge: 2024-03-25 | Disposition: A | Discharge status: Home / Self Care | Attending: Emergency Medicine | Admitting: Emergency Medicine

## 2024-03-24 ENCOUNTER — Encounter (HOSPITAL_BASED_OUTPATIENT_CLINIC_OR_DEPARTMENT_OTHER): Payer: Self-pay | Admitting: *Deleted

## 2024-03-24 DIAGNOSIS — S0181XA Laceration without foreign body of other part of head, initial encounter: Secondary | ICD-10-CM | POA: Insufficient documentation

## 2024-03-24 DIAGNOSIS — J45909 Unspecified asthma, uncomplicated: Secondary | ICD-10-CM | POA: Diagnosis not present

## 2024-03-24 DIAGNOSIS — I129 Hypertensive chronic kidney disease with stage 1 through stage 4 chronic kidney disease, or unspecified chronic kidney disease: Secondary | ICD-10-CM | POA: Diagnosis not present

## 2024-03-24 DIAGNOSIS — Y9301 Activity, walking, marching and hiking: Secondary | ICD-10-CM | POA: Insufficient documentation

## 2024-03-24 DIAGNOSIS — N189 Chronic kidney disease, unspecified: Secondary | ICD-10-CM | POA: Insufficient documentation

## 2024-03-24 DIAGNOSIS — W109XXA Fall (on) (from) unspecified stairs and steps, initial encounter: Secondary | ICD-10-CM | POA: Diagnosis not present

## 2024-03-24 DIAGNOSIS — S12001A Unspecified nondisplaced fracture of first cervical vertebra, initial encounter for closed fracture: Secondary | ICD-10-CM | POA: Diagnosis not present

## 2024-03-24 DIAGNOSIS — Z79899 Other long term (current) drug therapy: Secondary | ICD-10-CM | POA: Diagnosis not present

## 2024-03-24 DIAGNOSIS — Z23 Encounter for immunization: Secondary | ICD-10-CM | POA: Insufficient documentation

## 2024-03-24 LAB — CBC
HCT: 26.5 % — ABNORMAL LOW (ref 39.0–52.0)
Hemoglobin: 9.2 g/dL — ABNORMAL LOW (ref 13.0–17.0)
MCH: 34.2 pg — ABNORMAL HIGH (ref 26.0–34.0)
MCHC: 34.7 g/dL (ref 30.0–36.0)
MCV: 98.5 fL (ref 80.0–100.0)
Platelets: 168 K/uL (ref 150–400)
RBC: 2.69 MIL/uL — ABNORMAL LOW (ref 4.22–5.81)
RDW: 12.3 % (ref 11.5–15.5)
WBC: 5.3 K/uL (ref 4.0–10.5)
nRBC: 0 % (ref 0.0–0.2)

## 2024-03-24 LAB — BASIC METABOLIC PANEL WITH GFR
Anion gap: 12 (ref 5–15)
BUN: 43 mg/dL — ABNORMAL HIGH (ref 8–23)
CO2: 23 mmol/L (ref 22–32)
Calcium: 9.5 mg/dL (ref 8.9–10.3)
Chloride: 100 mmol/L (ref 98–111)
Creatinine, Ser: 2.54 mg/dL — ABNORMAL HIGH (ref 0.61–1.24)
GFR, Estimated: 24 mL/min — ABNORMAL LOW
Glucose, Bld: 111 mg/dL — ABNORMAL HIGH (ref 70–99)
Potassium: 4.7 mmol/L (ref 3.5–5.1)
Sodium: 135 mmol/L (ref 135–145)

## 2024-03-24 MED ORDER — OXYCODONE-ACETAMINOPHEN 5-325 MG PO TABS: 1.0000 | ORAL_TABLET | Freq: Four times a day (QID) | ORAL | 0 refills | Status: AC | PRN

## 2024-03-24 MED ORDER — LIDOCAINE-EPINEPHRINE (PF) 2 %-1:200000 IJ SOLN
10.0000 mL | Freq: Once | INTRAMUSCULAR | Status: AC
  Administered 2024-03-24: 10 mL
  Filled 2024-03-24: qty 20

## 2024-03-24 MED ORDER — IOHEXOL 350 MG/ML SOLN
50.0000 mL | Freq: Once | INTRAVENOUS | Status: AC | PRN
  Administered 2024-03-24: 50 mL via INTRAVENOUS

## 2024-03-24 NOTE — Discharge Instructions (Addendum)
 The angiogram of your head and neck shows no acute traumatic injury.  Please use Tylenol  for pain.  You may use 1000 mg of Tylenol  every 6 hours.  Not to exceed 4 g of Tylenol  within 24 hours.  You can use the stronger narcotic pain medication in place of Tylenol  for severe break through pain.  If you take the narcotic pain medication that we prescribed recommend that you also take a laxative such as MiraLAX  or Dulcolax every day that you take the narcotic pain medicine, and drink plenty of fluids, 50 to 64 ounces to prevent any constipation.  I would wear your cervical collar at all times, you can briefly remove it to bathe and change, but I would take care to keep your neck as stable as possible during these activities, you may want to have your family assist you with bathing and changing to ensure that your neck stays as stable as possible.  Call the neurosurgeon whose contact formation provided above to schedule close follow-up appointment.  You received sutures which are not dissolvable. Please return in 7-10 days for suture removal. I would place a bandage on the affected area and change it at least once daily, or more frequently if it becomes soiled. When changing bandage, clean the wound thoroughly with soap and water, and place a thin layer of Polysporin  or Neosporin directly over top of the wound before replacing bandage. Please monitor for any signs of developing infection including worsening redness, pain, pus draining from the affected site.  If you are concerned about any developing infection I recommend that you return for further evaluation for management.

## 2024-03-24 NOTE — ED Provider Notes (Signed)
 "  EMERGENCY DEPARTMENT AT [facility] Provider Note   CSN: [id number] Arrival date & time: 03/24/24  2105     Patient presents with: Darrell Hernandez. is a 89 y.o. male medical history significant for asthma, hypertension, CKD who reports his tetanus is up-to-date, within the last 3 to 4 years.  He endorses tripping on stairs walking upwards, fell leading with his forehead onto the brick steps.  Large laceration on forehead with some swelling.  Small skin tear on right wrist but he denies any pain in the wrist, pain in arms, legs, hips, chest.  Denies loss of consciousness.  Does not take a blood thinner.    Fall       Prior to Admission medications  Medication Sig Start Date End Date Taking? Authorizing Provider  amLODipine (NORVASC) 10 MG tablet Take 10 mg by mouth daily. 03/12/24  Yes [provider]  oxyCODONE -acetaminophen  (PERCOCET/ROXICET) 5-325 MG tablet Take 1 tablet by mouth every 6 (six) hours as needed for severe pain (pain score 7-10). 03/24/24  Yes Nelida Mandarino H, PA-C  esomeprazole (NEXIUM) 20 MG capsule 20 mg.    [provider]  ferrous sulfate 325 (65 FE) MG tablet Take 1 tablet by mouth.    [provider]  folic acid  (FOLVITE ) 800 MCG tablet 1 tablet Orally Once a day    [provider]  JARDIANCE 10 MG TABS tablet Take 10 mg by mouth daily.    [provider]  oxyCODONE  (ROXICODONE ) 5 MG immediate release tablet Take 1 tablet (5 mg total) by mouth every 6 (six) hours as needed for up to 5 doses for severe pain (pain score 7-10). 09/26/23   Shane Steffan BROCKS, MD  polyethylene glycol (MIRALAX ) 17 g packet Take 17 g by mouth daily. 09/26/23   Shane Steffan BROCKS, MD    Allergies: Other    Review of Systems  All other systems reviewed and are negative.   Updated Vital Signs BP (!) 150/75   Pulse 82   Temp 98.3 F (36.8 C) (Oral)   Resp 16   Ht 5' 7 (1.702 m)   Wt 63.5 kg   SpO2  95%   BMI 21.93 kg/m   Physical Exam Vitals and nursing note reviewed.  Constitutional:      General: He is not in acute distress.    Appearance: Normal appearance.  HENT:     Head: Normocephalic and atraumatic.  Eyes:     General:        Right eye: No discharge.        Left eye: No discharge.  Cardiovascular:     Rate and Rhythm: Normal rate and regular rhythm.     Heart sounds: No murmur heard.    No friction rub. No gallop.  Pulmonary:     Effort: Pulmonary effort is normal.     Breath sounds: Normal breath sounds.  Abdominal:     General: Bowel sounds are normal.     Palpations: Abdomen is soft.  Skin:    General: Skin is warm and dry.     Capillary Refill: Capillary refill takes less than 2 seconds.     Comments: Large around 6 x 5 cm T-shaped laceration on frontal forehead  Neurological:     Mental Status: He is alert and oriented to person, place, and time.  Psychiatric:        Mood and Affect: Mood normal.  Behavior: Behavior normal.     (all labs ordered are listed, but only abnormal results are displayed) Labs Reviewed  CBC - Abnormal; Notable for the following components:      Result Value   RBC 2.69 (*)    Hemoglobin 9.2 (*)    HCT 26.5 (*)    MCH 34.2 (*)    All other components within normal limits  BASIC METABOLIC PANEL WITH GFR - Abnormal; Notable for the following components:   Glucose, Bld 111 (*)    BUN 43 (*)    Creatinine, Ser 2.54 (*)    GFR, Estimated 24 (*)    All other components within normal limits    EKG: None  Radiology: CT Cervical Spine Wo Contrast Result Date: 03/24/2024 EXAM: CT CERVICAL SPINE WITHOUT CONTRAST 03/24/2024 09:45:17 PM TECHNIQUE: CT of the cervical spine was performed without the administration of intravenous contrast. Multiplanar reformatted images are provided for review. Automated exposure control, iterative reconstruction, and/or weight based adjustment of the mA/kV was utilized to reduce the radiation  dose to as low as reasonably achievable. COMPARISON: None available. CLINICAL HISTORY: Neck trauma (Age >= 65y). FINDINGS: BONES AND ALIGNMENT: Straightening of the cervical spine. 2 mm retrolisthesis C3-C4, likely degenerative in nature. Acute fracture of the right ring of C1 involving the posterior right pillar and extending through the expected course of the V3 portion of the right vertebral artery (series 51, image 6). No significant displacement. No malalignment. DEGENERATIVE CHANGES: Severe degenerative disc disease C3-C6 with disc space narrowing and endplate remodeling. Severe left neural foraminal narrowing C3-C4 and C4-C5. Mild canal stenosis C3-C4 secondary to retrolisthesis in combination with congenital shortening of the pedicles with an AP diameter of the spinal canal of approximately 8 mm. SOFT TISSUES: No prevertebral soft tissue swelling. VASCULATURE: Extensive atherosclerotic calcification within the carotid bifurcations, left greater than right. CT arteriography is recommended to assess the right vertebral artery. IMPRESSION: 1. Acute fracture of the right ring of C1 involving the posterior right pillar and extending through the expected course of the V3 portion of the right vertebral artery, without significant displacement or malalignment; CT arteriography is recommended to assess the right vertebral artery. 2. Severe degenerative disc disease from C3 through C6. 3. Severe left neural foraminal narrowing at C3-4 and C4-5 4. Extensive atherosclerotic calcification at the carotid bifurcations, left greater than right. Electronically signed by: Dorethia Molt MD MD 03/24/2024 09:56 PM EST RP Workstation: [id number]   CT Head Wo Contrast Result Date: 03/24/2024 EXAM: CT HEAD WITHOUT CONTRAST 03/24/2024 09:45:17 PM TECHNIQUE: CT of the head was performed without the administration of intravenous contrast. Automated exposure control, iterative reconstruction, and/or weight based adjustment of the  mA/kV was utilized to reduce the radiation dose to as low as reasonably achievable. COMPARISON: None available. CLINICAL HISTORY: Head trauma, minor (Age >= 65y). FINDINGS: BRAIN AND VENTRICLES: No acute hemorrhage. No evidence of acute infarct. Chronic lacunar infarcts in bilateral basal ganglia. Age-related atrophy. Mild to moderate periventricular white matter disease. No hydrocephalus. No extra-axial collection. No mass effect or midline shift. ORBITS: No acute abnormality. Status post bilateral lens replacement. SINUSES: No acute abnormality. SOFT TISSUES AND SKULL: Right frontal scalp soft tissue swelling and laceration without underlying fracture. VASCULATURE: Moderate vascular calcifications. IMPRESSION: 1. No acute intracranial abnormality. 2. Right frontal scalp soft tissue swelling and laceration without underlying fracture. 3. Age-related atrophy and mild to moderate chronic periventricular white matter disease. 4. Chronic lacunar infarcts in the bilateral basal ganglia. 5. Status post bilateral  lens replacement. 6. Moderate vascular calcifications. Electronically signed by: Dorethia Molt MD MD 03/24/2024 09:48 PM EST RP Workstation: [id number]     .Laceration Repair  Date/Time: 03/24/2024 11:18 PM  Performed by: Rosan Sherlean DEL, PA-C Authorized by: Rosan Sherlean DEL, PA-C   Consent:    Consent obtained:  Verbal   Consent given by:  Patient   Risks, benefits, and alternatives were discussed: yes     Risks discussed:  Infection, pain, poor cosmetic result, tendon damage, vascular damage, poor wound healing, need for additional repair and nerve damage   Alternatives discussed:  No treatment Universal protocol:    Procedure explained and questions answered to patient or proxy's satisfaction: yes     Patient identity confirmed:  Verbally with patient Anesthesia:    Anesthesia method:  Local infiltration   Local anesthetic:  Lidocaine  2% WITH epi Laceration details:     Location:  Scalp   Scalp location:  Frontal   Length (cm):  13   Depth (mm):  5 Treatment:    Area cleansed with:  Povidone-iodine and saline   Amount of cleaning:  Extensive Skin repair:    Repair method:  Sutures   Suture size:  4-0   Suture material:  Prolene   Suture technique:  Simple interrupted   Number of sutures:  14 Approximation:    Approximation:  Close Repair type:    Repair type:  Simple Post-procedure details:    Dressing:  Non-adherent dressing   Procedure completion:  Tolerated    Medications Ordered in the ED  iohexol  (OMNIPAQUE ) 350 MG/ML injection 50 mL (has no administration in time range)  lidocaine -EPINEPHrine  (XYLOCAINE  W/EPI) 2 %-1:200000 (PF) injection 10 mL (10 mLs Infiltration Given by Other 03/24/24 2204)                                    Medical Decision Making Amount and/or Complexity of Data Reviewed Labs: ordered. Radiology: ordered.  Risk Prescription drug management.   This patient is a 89 y.o. male  who presents to the ED for concern of fall, head injury.   Differential diagnoses prior to evaluation: The emergent differential diagnosis includes, but is not limited to,  epidural hematoma, subdural hematoma, skull fracture, subarachnoid hemorrhage, unstable cervical spine fracture, concussion vs other MSK injury  . This is not an exhaustive differential.   Past Medical History / Co-morbidities / Social History: asthma, hypertension, CKD who reports his tetanus is up-to-date, within the last 3 to 4 years  Physical Exam: Physical exam performed. The pertinent findings include: Normal sensation of bilateral upper and lower extremities.  Intact strength to flexion, extension of bilateral upper extremities, flexion, extension of bilateral lower extremities.  Lab Tests/Imaging studies: I personally interpreted labs/imaging and the pertinent results include:  CBC overall unremarkable, stable anemia, hemoglobin 9.2.  BMP notable for  elevated creatinine, BUN, 2.54, 43, with a GFR of 24.  CT C-spine shows  1. Acute fracture of the right ring of C1 involving the posterior right pillar  and extending through the expected course of the V3 portion of the right  vertebral artery, without significant displacement or malalignment; CT  arteriography is recommended to assess the right vertebral artery.  2. Severe degenerative disc disease from C3 through C6.  3. Severe left neural foraminal narrowing at C3-4 and C4-5  4. Extensive atherosclerotic calcification at the carotid bifurcations, left  greater than right.  CT head shows:  1. No acute intracranial abnormality.  2. Right frontal scalp soft tissue swelling and laceration without underlying  fracture.  3. Age-related atrophy and mild to moderate chronic periventricular white  matter disease.  4. Chronic lacunar infarcts in the bilateral basal ganglia.  5. Status post bilateral lens replacement.  6. Moderate vascular calcifications.  . I agree with the radiologist interpretation.  CTA head and neck with and without contrast media pending CTA head neck  Consults: Spoke with nephrologist, Dr. Geralynn who acquiesces to decrease contrast load despite CKD due to importance for assessment of head and neck vessels  Spoke with neurosurgery, Lauraine Pickle, PA who agrees with plan for hard cervical collar, close neurosurgical follow-up as long as no vascular injury on CTA.  11:43 PM Care of Richardo Popoff. transferred to Dr. Geroldine at the end of my shift as the patient will require reassessment once labs/imaging have resulted. Patient presentation, ED course, and plan of care discussed with review of all pertinent labs and imaging. Please see his/her note for further details regarding further ED course and disposition. Plan at time of handoff is ending CTA, patient will have discharge, follow-up with neurosurgery. This may be altered or completely changed at the discretion of  the oncoming team pending results of further workup.  Final diagnoses:  Closed nondisplaced fracture of first cervical vertebra, unspecified fracture morphology, initial encounter [facility])    ED Discharge Orders          Ordered    oxyCODONE -acetaminophen  (PERCOCET/ROXICET) 5-325 MG tablet  Every 6 hours PRN        03/24/24 2343               Rosan Sherlean DEL, PA-C 03/24/24 2344  "

## 2024-03-24 NOTE — ED Provider Notes (Signed)
 I provided a substantive portion of the care of this patient.  I personally made/approved the management plan for this patient and take responsibility for the patient management.      89 year old male here mechanical fall onto his head.  Does have a C1 fracture.  Laceration noted as well and was repaired.  Will consult neurosurgery.  Patient in a hard collar.  No focal deficits on neurological exam at this time   Dasie Faden, MD 03/24/24 2304

## 2024-03-24 NOTE — ED Triage Notes (Signed)
 Pt arrives from home via [facility]. Per EMS report, pt fell and tripped on front steps. Lac (2 inches) right forehead. Oriented. Soreness to the side of the neck. No pain on movement. IV established in the left , forearm. Vitals 150/70, hr 70, 99% RA, cbg 128, RR 18, 98.2 temp.

## 2024-03-24 NOTE — ED Triage Notes (Addendum)
 Pt reports his toe caught on the first step and he fell full force onto the edge of the brick steps. Tetanus 3-4 years ago. No loc, denies nausea or dizziness. Large T shaped laceration to the upper right forehead with some swelling. Skin tear posterior right wrist.

## 2024-03-25 MED ORDER — OXYCODONE-ACETAMINOPHEN 5-325 MG PO TABS
1.0000 | ORAL_TABLET | Freq: Once | ORAL | Status: AC
  Administered 2024-03-25: 1 via ORAL
  Filled 2024-03-25: qty 1

## 2024-04-03 ENCOUNTER — Other Ambulatory Visit: Payer: Self-pay

## 2024-04-03 ENCOUNTER — Emergency Department (HOSPITAL_BASED_OUTPATIENT_CLINIC_OR_DEPARTMENT_OTHER)
Admission: EM | Admit: 2024-04-03 | Discharge: 2024-04-03 | Disposition: A | Discharge status: Home / Self Care | Attending: Emergency Medicine | Admitting: Emergency Medicine

## 2024-04-03 ENCOUNTER — Encounter (HOSPITAL_BASED_OUTPATIENT_CLINIC_OR_DEPARTMENT_OTHER): Payer: Self-pay | Admitting: Emergency Medicine

## 2024-04-03 DIAGNOSIS — X58XXXD Exposure to other specified factors, subsequent encounter: Secondary | ICD-10-CM | POA: Insufficient documentation

## 2024-04-03 DIAGNOSIS — S0181XD Laceration without foreign body of other part of head, subsequent encounter: Secondary | ICD-10-CM | POA: Diagnosis not present

## 2024-04-03 DIAGNOSIS — Z4802 Encounter for removal of sutures: Secondary | ICD-10-CM | POA: Diagnosis present

## 2024-04-03 NOTE — ED Triage Notes (Signed)
 Pt via pov from home for suture removal. Sutures placed 10 days ago. Denies any complications or fevers. Pt a&o x 4; nad noted.

## 2024-04-03 NOTE — ED Provider Notes (Signed)
 " [place] EMERGENCY DEPARTMENT AT [facility] Provider Note   CSN: [id number] Arrival date & time: 04/03/24  1604     Patient presents with: Suture / Staple Removal   Branch Pacitti. is a 89 y.o. male who presents for suture removal after having laceration repaired on 24 March 2024.  Note from then does show 14 simple interrupted sutures placed in the forehead without complication.  Since then he has not had any purulent exudate, has not had any increased pain or swelling noted in the affected area.    Suture / Staple Removal       Prior to Admission medications  Medication Sig Start Date End Date Taking? Authorizing Provider  amLODipine (NORVASC) 10 MG tablet Take 10 mg by mouth daily. 03/12/24   [provider]  esomeprazole (NEXIUM) 20 MG capsule 20 mg.    [provider]  ferrous sulfate 325 (65 FE) MG tablet Take 1 tablet by mouth.    [provider]  folic acid  (FOLVITE ) 800 MCG tablet 1 tablet Orally Once a day    [provider]  JARDIANCE 10 MG TABS tablet Take 10 mg by mouth daily.    [provider]  oxyCODONE  (ROXICODONE ) 5 MG immediate release tablet Take 1 tablet (5 mg total) by mouth every 6 (six) hours as needed for up to 5 doses for severe pain (pain score 7-10). 09/26/23   Shane Steffan BROCKS, MD  oxyCODONE -acetaminophen  (PERCOCET/ROXICET) 5-325 MG tablet Take 1 tablet by mouth every 6 (six) hours as needed for severe pain (pain score 7-10). 03/24/24   Prosperi, Christian H, PA-C  polyethylene glycol (MIRALAX ) 17 g packet Take 17 g by mouth daily. 09/26/23   Shane Steffan BROCKS, MD    Allergies: Other    Review of Systems  Skin:  Positive for wound.  All other systems reviewed and are negative.   Updated Vital Signs BP (!) 152/77 (BP Location: Right Arm)   Pulse 63   Temp (!) 97.5 F (36.4 C)   Resp 17   Ht 5' 7 (1.702 m)   Wt 63.5 kg   SpO2 97%   BMI 21.93 kg/m   Physical Exam Vitals and  nursing note reviewed.  Constitutional:      General: He is not in acute distress.    Appearance: He is well-developed.  HENT:     Head: Normocephalic and atraumatic.  Eyes:     Conjunctiva/sclera: Conjunctivae normal.  Cardiovascular:     Rate and Rhythm: Normal rate and regular rhythm.     Heart sounds: No murmur heard. Pulmonary:     Effort: Pulmonary effort is normal. No respiratory distress.     Breath sounds: Normal breath sounds.  Abdominal:     Palpations: Abdomen is soft.     Tenderness: There is no abdominal tenderness.  Musculoskeletal:        General: No swelling.     Cervical back: Neck supple.  Skin:    General: Skin is warm and dry.     Capillary Refill: Capillary refill takes less than 2 seconds.     Comments: Well-healing wound noted to the forehead with 14 simple interrupted sutures noted.  Neurological:     Mental Status: He is alert.  Psychiatric:        Mood and Affect: Mood normal.     (all labs ordered are listed, but only abnormal results are displayed) Labs Reviewed - No data to display  EKG: None  Radiology: No results found.   Suture Removal  Date/Time: 04/03/2024 5:20 PM  Performed by: Myriam Dorn BROCKS, PA Authorized by: Myriam Dorn BROCKS, PA   Consent:    Consent obtained:  Verbal   Consent given by:  Patient   Risks, benefits, and alternatives were discussed: yes     Risks discussed:  Bleeding, pain and wound separation   Alternatives discussed:  No treatment, delayed treatment, alternative treatment, observation and referral Universal protocol:    Procedure explained and questions answered to patient or proxy's satisfaction: yes     Patient identity confirmed:  Verbally with patient, arm band and hospital-assigned identification number Location:    Location:  Head/neck   Head/neck location:  Scalp Procedure details:    Wound appearance:  No signs of infection, good wound healing and clean   Number of sutures removed:   14 Post-procedure details:    Post-removal:  Antibiotic ointment applied   Procedure completion:  Tolerated well, no immediate complications    Medications Ordered in the ED - No data to display                                  Medical Decision Making  Sutures removed as per the procedure note, no complications from procedure, post removal care instructions discussed with patient and his spouse who both verbalized understanding and agreement have no further concerns at this time.  Wound did appear to be well-healing, normal healthy tissue noted without any purulence nor periwound erythema or edema noted.  As such we will discharge patient with outpatient follow-up.     Final diagnoses:  Visit for suture removal    ED Discharge Orders     None          Myriam Dorn BROCKS, PA 04/03/24 1721    Pamella Ozell LABOR, DO 04/03/24 2317  "
